# Patient Record
Sex: Female | Born: 1945 | Race: White | Hispanic: No | Marital: Married | Smoking: Former smoker
Health system: Southern US, Community
[De-identification: ages and names within clinical notes are randomized; demographics above are authoritative.]

## PROBLEM LIST (undated history)

## (undated) DIAGNOSIS — I10 Essential (primary) hypertension: Secondary | ICD-10-CM

## (undated) DIAGNOSIS — T8859XA Other complications of anesthesia, initial encounter: Secondary | ICD-10-CM

## (undated) DIAGNOSIS — T4145XA Adverse effect of unspecified anesthetic, initial encounter: Secondary | ICD-10-CM

## (undated) DIAGNOSIS — M1990 Unspecified osteoarthritis, unspecified site: Secondary | ICD-10-CM

## (undated) DIAGNOSIS — M419 Scoliosis, unspecified: Secondary | ICD-10-CM

## (undated) DIAGNOSIS — R079 Chest pain, unspecified: Secondary | ICD-10-CM

## (undated) DIAGNOSIS — H9190 Unspecified hearing loss, unspecified ear: Secondary | ICD-10-CM

## (undated) DIAGNOSIS — N6009 Solitary cyst of unspecified breast: Secondary | ICD-10-CM

## (undated) DIAGNOSIS — C50919 Malignant neoplasm of unspecified site of unspecified female breast: Secondary | ICD-10-CM

## (undated) DIAGNOSIS — F419 Anxiety disorder, unspecified: Secondary | ICD-10-CM

## (undated) DIAGNOSIS — C801 Malignant (primary) neoplasm, unspecified: Secondary | ICD-10-CM

## (undated) HISTORY — DX: Anxiety disorder, unspecified: F41.9

## (undated) HISTORY — DX: Malignant neoplasm of unspecified site of unspecified female breast: C50.919

## (undated) HISTORY — PX: TUBAL LIGATION: SHX77

## (undated) HISTORY — DX: Solitary cyst of unspecified breast: N60.09

## (undated) HISTORY — DX: Unspecified osteoarthritis, unspecified site: M19.90

## (undated) HISTORY — PX: EYE SURGERY: SHX253

## (undated) HISTORY — PX: STRABISMUS SURGERY: SHX218

---

## 1992-01-10 HISTORY — PX: OTHER SURGICAL HISTORY: SHX169

## 2001-01-09 DIAGNOSIS — C801 Malignant (primary) neoplasm, unspecified: Secondary | ICD-10-CM

## 2001-01-09 DIAGNOSIS — C50919 Malignant neoplasm of unspecified site of unspecified female breast: Secondary | ICD-10-CM

## 2001-01-09 HISTORY — DX: Malignant neoplasm of unspecified site of unspecified female breast: C50.919

## 2001-01-09 HISTORY — DX: Malignant (primary) neoplasm, unspecified: C80.1

## 2001-12-09 HISTORY — PX: BREAST LUMPECTOMY: SHX2

## 2002-02-24 ENCOUNTER — Ambulatory Visit: Admission: RE | Admit: 2002-02-24 | Discharge: 2002-04-25 | Payer: Self-pay | Admitting: *Deleted

## 2004-04-21 ENCOUNTER — Ambulatory Visit: Payer: Self-pay | Admitting: Internal Medicine

## 2004-05-10 ENCOUNTER — Ambulatory Visit (HOSPITAL_COMMUNITY): Admission: RE | Admit: 2004-05-10 | Discharge: 2004-05-10 | Payer: Self-pay | Admitting: Internal Medicine

## 2004-05-10 ENCOUNTER — Ambulatory Visit: Payer: Self-pay | Admitting: Internal Medicine

## 2009-08-27 ENCOUNTER — Encounter: Admission: RE | Admit: 2009-08-27 | Discharge: 2009-08-27 | Payer: Self-pay | Admitting: Oral Surgery

## 2010-05-27 NOTE — Op Note (Signed)
NAME:  Kristina Sloan, Kristina Sloan                 ACCOUNT NO.:  [id number]   MEDICAL RECORD NO.:  [id number]          PATIENT TYPE:  AMB   LOCATION:  DAY                           FACILITY:  [facility]   PHYSICIAN:  R. Roetta Sessions, M.D. DATE OF BIRTH:  1945/01/11   DATE OF PROCEDURE:  05/10/2004  DATE OF DISCHARGE:                                 OPERATIVE REPORT   PROCEDURE:  Colonoscopy, diagnostic.   INDICATION FOR PROCEDURE:  The patient is a 65 year old referred out of the  courtesy of Lynett Fish, M.D., for colorectal cancer screening.  She has  occasional low-volume painless hematochezia in the setting of constipation.  She has never had her lower GI tract imaged.  There is no family history of  colorectal neoplasia.  She has a personal history of breast cancer, however.  Colonoscopy is now being done.  This approach has been discussed with the  patient at length, the potential risks, benefits, and alternatives have been  reviewed.  She is agreeable.   PROCEDURE NOTE:  O2 saturation, blood pressure, pulse, and respiration were  monitored throughout the entire procedure.   CONSCIOUS SEDATION:  Versed 5 mg IV, Demerol 75 mg IV in divided doses.   INSTRUMENT USED:  Olympus video chip system.   FINDINGS:  Digital rectal exam revealed no abnormalities.  Endoscopic  findings:  Prep was good.   Rectum:  Examination of the rectal mucosa including retroflexed view of the  anal verge revealed only internal hemorrhoids.  The rectal mucosa otherwise  appeared normal.   Colon:  The colonic mucosa was surveyed from the rectosigmoid junction  through the left, transverse and right colon to the area of the appendiceal  orifice, ileocecal valve and cecum.  These structures were well-seen and  photographed for the record.  From this level the scope was slowly withdrawn  and all previously-mentioned mucosal surfaces were again seen.  The colonic  mucosa appeared normal.  The patient tolerated the  procedure well and was  reacted in endoscopy.   IMPRESSION:  1.  Internal hemorrhoids, otherwise normal rectum.  2.  Normal colon.  3.  I suspect the patient has bled from hemorrhoids.   RECOMMENDATIONS:  1.  Hemorrhoid literature provided to [name].  2.  Daily fiber supplement of Metamucil or Citrucel.  3.  A 10-day course of Anusol-HC suppositories one per rectum at bedtime.  4.  This lady should have a high-risk screening colonoscopy in five years.      RMR/MEDQ  D:  05/10/2004  T:  05/10/2004  Job:  [id number]   cc:   Lynett Fish, M.D.  [address] [place]  [place] [address]  Fax: [phone]   Kirk Ruths, M.D.  [address]  [place]  [place] [address]  Fax: [phone]

## 2010-05-27 NOTE — Consult Note (Signed)
NAME:  Kristina Sloan, Kristina Sloan                 ACCOUNT NO.:  [id number]   MEDICAL RECORD NO.:  [id number]          PATIENT TYPE:  AMB   LOCATION:                                 FACILITY:   PHYSICIAN:  R. Roetta Sessions, M.D. DATE OF BIRTH:  Jun 19, 1945   DATE OF CONSULTATION:  DATE OF DISCHARGE:                                   CONSULTATION   REQUESTING PHYSICIAN:  Kirk Ruths, M.D.   REASON FOR CONSULTATION:  Need for colonoscopy.   HISTORY OF PRESENT ILLNESS:  [name] is a 65 year old Caucasian female with  history of breast carcinoma diagnosed in December 2003.  She is followed by  Dr. Cleone Slim and has had lumpectomy and radiation therapy.  She has continued to  do well and is taking tamoxifen on a daily basis.  She notes history of  intermittent hematochezia over the last couple of years.  She notes a large  amount of sporadic bright red rectal bleeding while having a bowel movement.  She has noticed blood in the toilet as well as on the toilet paper.  She has  a history of chronic constipation and has had a history of a bowel movement  about twice a week until recently, when she was started on Nicholls, which  has caused her to have a bowel movement on a daily basis or every other day.  She also notes her constipation has worsened since she has been taking  calcium 1650 mg a day given her history of osteopenia.  She denies any mucus  in her stools or melena.  Reportedly her weight is stable.  She denies any  diarrhea.  She does report occasional abdominal bloating, for which she  takes Phazyme with good relief.   PAST MEDICAL HISTORY:  1.  Breast carcinoma, status post lumpectomy and radiation in 2003 by Dr.      Cleone Slim, followed by Dr. Cleone Slim, currently on tamoxifen.  2.  Hypertension.  3.  Osteopenia.  4.  __________  surgery for a left facial twitch, which left subsequent left      ear deafness.   CURRENT MEDICATIONS:  1.  Calcium 600 mg two tablets daily.  2.  A multivitamin  with 450 mg of calcium daily.  3.  Diltiazem 240 mg daily.  4.  Hydrochlorothiazide 12.5 mg daily.  5.  Tamoxifen 20 mg daily.  6.  Kristalose 20 g daily.  7.  Xanax 0.25 mg p.r.n.  8.  Ibuprofen 200 mg p.r.n.  9.  Correctol p.r.n.  10. Phazyme p.r.n.   ALLERGIES:  No known drug allergies.   FAMILY HISTORY:  No known family history of colorectal carcinoma.  No liver  or chronic GI problems.  Mother, age 41, and father, age 23, are alive with  history of hypertension.  Her father has small-cell bladder carcinoma and  renal failure.  She has one brother with hypertension, one healthy half-  sister.   SOCIAL HISTORY:  [name] is currently single.  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  She reports a 30+  pack-year history of  tobacco use, quitting approximately 10 years ago.  She consumes about two  glasses of wine per day.  Denies any drug use.   REVIEW OF SYSTEMS:  CONSTITUTIONAL:  Weight is stable.  She denies any fever  or chills.  Denies any anorexia or early satiety.  Appetite is good.  CARDIOVASCULAR:  Denies any chest pain or palpitations.  PULMONARY:  Denies  any shortness of breath, dyspnea, cough or hemoptysis.  GASTROINTESTINAL:  See HPI.  She has very infrequent heartburn, indigestion, which is relieved  with over-the-counter antacids.  Denies any dysphagia or odynophagia.  GYNECOLOGIC:  Is postmenopausal for the last 10 years.   PHYSICAL EXAMINATION:  VITAL SIGNS:  Weight 144 pounds, height 65-1/2  inches.  Temperature 97.8, blood pressure 140/90, pulse 104.  GENERAL:  [name] is a 65 year old well-developed, well-nourished Caucasian  female who appears younger than her stated age.  She is alert and oriented,  pleasant and cooperative, in no acute distress.  HEENT:  Sclerae clear.  Conjunctivae pin.  Oropharynx pink and grossly with  out lesions.  NECK:  Supple without mass or thyromegaly.  CHEST:  Heart regular rate and rhythm with normal S1,  S2, without any  murmurs, rubs, thrills or gallops.  CHEST:  Lungs clear to auscultation bilaterally.  ABDOMEN:  Flat with positive bowel sounds x4.  No bruits auscultated.  Soft,  nontender, nondistended, without palpable mass or hepatosplenomegaly.  No  rebound tenderness or guarding.  EXTREMITIES:  2+ pedal pulses bilaterally.  No edema.  SKIN:  Pink, warm and dry without any rash or jaundice.   IMPRESSION:  [name] is a 65 year old Caucasian female who has had  intermittent hematochezia over the last couple of years as well as chronic  constipation.  She also has a personal history of breast carcinoma;  therefore, she does need colonoscopy as she is at higher risk than the  general population for colon cancer.  It will be diagnostic as well to  determine the source of her intermittent hematochezia, which could very well  be related to benign anorectal hemorrhoids.   RECOMMENDATIONS:  1.  Will schedule colonoscopy with Dr. Jena Gauss in the near future.  I have      discussed this procedure, including risks and benefits to include but      not limited to bleeding, infection, perforation, drug reaction.  She      agrees and informed consent will be obtained.  2.  As far as her constipation is concerned, she is responding well to      [facility] and can continue this regimen.  3.  She is asking regarding discontinuation of her calcium and I feel given      her history of osteopenia, she should      continue the calcium and her Kristalose dose can be adjusted accordingly      along with other modalities if necessary.   We would like to thank Dr. Regino Schultze for allowing [place] to participate in the  care of [name].      KC/MEDQ  D:  04/21/2004  T:  04/21/2004  Job:  [id number]   cc:   Kirk Ruths, M.D.  [address]  [place]  [place] [address]  Fax: [phone]

## 2012-05-23 DIAGNOSIS — R079 Chest pain, unspecified: Secondary | ICD-10-CM

## 2012-05-23 HISTORY — DX: Chest pain, unspecified: R07.9

## 2012-05-24 ENCOUNTER — Observation Stay (HOSPITAL_COMMUNITY)
Admission: EM | Admit: 2012-05-24 | Discharge: 2012-05-24 | Disposition: A | Discharge status: Home / Self Care | Payer: 59 | Attending: Internal Medicine | Admitting: Internal Medicine

## 2012-05-24 ENCOUNTER — Other Ambulatory Visit: Payer: Self-pay | Admitting: Cardiology

## 2012-05-24 ENCOUNTER — Emergency Department (HOSPITAL_COMMUNITY): Payer: 59

## 2012-05-24 ENCOUNTER — Encounter (HOSPITAL_COMMUNITY): Payer: Self-pay

## 2012-05-24 DIAGNOSIS — I10 Essential (primary) hypertension: Secondary | ICD-10-CM | POA: Diagnosis present

## 2012-05-24 DIAGNOSIS — R079 Chest pain, unspecified: Secondary | ICD-10-CM | POA: Diagnosis present

## 2012-05-24 DIAGNOSIS — R0789 Other chest pain: Principal | ICD-10-CM | POA: Insufficient documentation

## 2012-05-24 DIAGNOSIS — E785 Hyperlipidemia, unspecified: Secondary | ICD-10-CM | POA: Diagnosis present

## 2012-05-24 DIAGNOSIS — Z853 Personal history of malignant neoplasm of breast: Secondary | ICD-10-CM | POA: Insufficient documentation

## 2012-05-24 DIAGNOSIS — Z87891 Personal history of nicotine dependence: Secondary | ICD-10-CM | POA: Insufficient documentation

## 2012-05-24 HISTORY — DX: Unspecified hearing loss, unspecified ear: H91.90

## 2012-05-24 HISTORY — DX: Essential (primary) hypertension: I10

## 2012-05-24 HISTORY — DX: Other complications of anesthesia, initial encounter: T88.59XA

## 2012-05-24 HISTORY — DX: Chest pain, unspecified: R07.9

## 2012-05-24 HISTORY — DX: Malignant (primary) neoplasm, unspecified: C80.1

## 2012-05-24 HISTORY — DX: Adverse effect of unspecified anesthetic, initial encounter: T41.45XA

## 2012-05-24 LAB — BASIC METABOLIC PANEL
BUN: 7 mg/dL (ref 6–23)
CO2: 27 mEq/L (ref 19–32)
Chloride: 97 mEq/L (ref 96–112)
GFR calc non Af Amer: >90 mL/min (ref >90)
Glucose, Bld: 107 mg/dL — ABNORMAL HIGH (ref 70–99)
Potassium: 4.1 mEq/L (ref 3.5–5.1)
Sodium: 135 mEq/L (ref 135–145)

## 2012-05-24 LAB — CBC WITH DIFFERENTIAL/PLATELET
Eosinophils Absolute: 0 10*3/uL (ref 0.0–0.7)
HCT: 38.9 % (ref 36.0–46.0)
Lymphocytes Relative: 28 % (ref 12–46)
Lymphs Abs: 1.5 10*3/uL (ref 0.7–4.0)
MCH: 30.8 pg (ref 26.0–34.0)
MCHC: 35.5 g/dL (ref 30.0–36.0)
Monocytes Relative: 14 % — ABNORMAL HIGH (ref 3–12)
RDW: 12.8 % (ref 11.5–15.5)

## 2012-05-24 LAB — POCT I-STAT, CHEM 8
Chloride: 97 mEq/L (ref 96–112)
Potassium: 3.7 mEq/L (ref 3.5–5.1)
TCO2: 31 mmol/L (ref 0–100)

## 2012-05-24 LAB — CBC
HCT: 39.5 % (ref 36.0–46.0)
Hemoglobin: 13.9 g/dL (ref 12.0–15.0)
MCHC: 35.2 g/dL (ref 30.0–36.0)
RBC: 4.55 MIL/uL (ref 3.87–5.11)

## 2012-05-24 LAB — POCT I-STAT TROPONIN I: Troponin i, poc: 0 ng/mL (ref 0.00–0.08)

## 2012-05-24 LAB — HEMOGLOBIN A1C
Hgb A1c MFr Bld: 5.3 % (ref <5.7)
Mean Plasma Glucose: 105 mg/dL (ref <117)

## 2012-05-24 LAB — TROPONIN I
Troponin I: <0.3 ng/mL (ref <0.30)
Troponin I: <0.3 ng/mL (ref <0.30)

## 2012-05-24 MED ORDER — OMEGA-3 FATTY ACIDS 1000 MG PO CAPS: 2.0000 g | ORAL_CAPSULE | Freq: Every day | ORAL | Status: DC

## 2012-05-24 MED ORDER — DILTIAZEM HCL ER COATED BEADS 240 MG PO TB24: 240.0000 mg | ORAL_TABLET | Freq: Every day | ORAL | Status: DC

## 2012-05-24 MED ORDER — ASPIRIN 325 MG PO TABS
325.0000 mg | ORAL_TABLET | Freq: Once | ORAL | Status: AC
  Administered 2012-05-24: 325 mg via ORAL
  Filled 2012-05-24: qty 1

## 2012-05-24 MED ORDER — SODIUM CHLORIDE 0.9 % IJ SOLN: 3.0000 mL | Freq: Two times a day (BID) | INTRAMUSCULAR | Status: DC

## 2012-05-24 MED ORDER — ASPIRIN EC 325 MG PO TBEC: 325.0000 mg | DELAYED_RELEASE_TABLET | Freq: Every day | ORAL | Status: DC

## 2012-05-24 MED ORDER — HYDROCHLOROTHIAZIDE 12.5 MG PO CAPS: 12.5000 mg | ORAL_CAPSULE | Freq: Every day | ORAL | Status: DC

## 2012-05-24 MED ORDER — ONDANSETRON HCL 4 MG/2ML IJ SOLN: 4.0000 mg | Freq: Four times a day (QID) | INTRAMUSCULAR | Status: DC | PRN

## 2012-05-24 MED ORDER — OMEGA-3-ACID ETHYL ESTERS 1 G PO CAPS: 2.0000 g | ORAL_CAPSULE | Freq: Every day | ORAL | Status: DC

## 2012-05-24 MED ORDER — ENOXAPARIN SODIUM 40 MG/0.4ML ~~LOC~~ SOLN
40.0000 mg | SUBCUTANEOUS | Status: DC
  Filled 2012-05-24: qty 0.4

## 2012-05-24 MED ORDER — ACETAMINOPHEN 325 MG PO TABS: 650.0000 mg | ORAL_TABLET | Freq: Four times a day (QID) | ORAL | Status: DC | PRN

## 2012-05-24 MED ORDER — OXYCODONE HCL 5 MG PO TABS: 5.0000 mg | ORAL_TABLET | ORAL | Status: DC | PRN

## 2012-05-24 MED ORDER — ACETAMINOPHEN 650 MG RE SUPP: 650.0000 mg | Freq: Four times a day (QID) | RECTAL | Status: DC | PRN

## 2012-05-24 MED ORDER — VITAMIN D-3 25 MCG (1000 UT) PO CAPS: 2000.0000 [IU] | ORAL_CAPSULE | Freq: Every day | ORAL | Status: DC

## 2012-05-24 MED ORDER — NIACIN 500 MG PO TABS
500.0000 mg | ORAL_TABLET | Freq: Every evening | ORAL | Status: DC
  Filled 2012-05-24: qty 1

## 2012-05-24 MED ORDER — ASPIRIN 325 MG PO TBEC: 325.0000 mg | DELAYED_RELEASE_TABLET | Freq: Every day | ORAL | Status: DC

## 2012-05-24 MED ORDER — ALPRAZOLAM 0.5 MG PO TABS: 0.5000 mg | ORAL_TABLET | Freq: Three times a day (TID) | ORAL | Status: DC | PRN

## 2012-05-24 MED ORDER — ONDANSETRON HCL 4 MG PO TABS: 4.0000 mg | ORAL_TABLET | Freq: Four times a day (QID) | ORAL | Status: DC | PRN

## 2012-05-24 MED ORDER — VITAMIN D3 25 MCG (1000 UNIT) PO TABS: 2000.0000 [IU] | ORAL_TABLET | Freq: Every day | ORAL | Status: DC

## 2012-05-24 MED ORDER — NIACIN 500 MG PO TABS: 500.0000 mg | ORAL_TABLET | Freq: Every day | ORAL | Status: DC

## 2012-05-24 NOTE — Consult Note (Signed)
Reason for Consult: Chest Pain Referring Physician: TRH  HPI: the patient is a 67 y/o female with a history significant for HTN, HLD, past history of tobacco abuse, having quit over 20 years ago, and a history of breast cancer, who presented to the [facility] ER today with a complaint of chest pain. She has no known CAD and does not have an established cardiologist. She reports that she was in her usual state of health, until yesterday when she developed substernal chest discomfort, while at work, at a credit union. She states that she has never had pain like this before and never gets chest pain with exertion. The pain was described as substernal pressure, with radiation to the back, neck and bilateral jaws. ~7/10 discomfort. No exacerbating factors. It was not worsened by exertion, and actually got better when she got up to walk outside. The episode lasted ~20 minutes before resolving completely. No associated symptoms. She denies SOB, n/v, diaphoresis, syncope/presyncope. No orthopnea, PND, LEE, claudication, hematuria, melena or hematochezia. She contacted her PCP in [place] today to notify them of her recent symptoms and he encouraged her to come to the ER for evaluation. She has had no further chest pain since yesterday. Her EKG is normal without acute changes. CXR is normal and POC troponin negative x 1.  The patient states that she underwent an exercise stress test ~3 years ago that was normal.   Past Medical History  Diagnosis Date  . Hard of hearing   . Complication of anesthesia     DIFFICULT WAKING ONLY ONCE  . Hypertension   . Chest pain 05/23/2012  . Cancer     BREAST    Past Surgical History  Procedure Laterality Date  . Eye surgery    . Tubal ligation    . Breast surgery      LUMPECTOMY    History reviewed. No pertinent family history.  Social History:  reports that she quit smoking about 22 years ago. She has never used smokeless tobacco. She reports that  drinks alcohol. She  reports that she does not use illicit drugs.  Allergies: No Known Allergies  Medications:  Prior to Admission:  Prescriptions prior to admission  Medication Sig Dispense Refill  . alendronate (FOSAMAX) 70 MG tablet Take 70 mg by mouth every 7 (seven) days. Take with a full glass of water on an empty stomach.      . ALPRAZolam (XANAX) 1 MG tablet Take 0.5 mg by mouth 3 (three) times daily as needed for anxiety.      Marland Kitchen aspirin EC 81 MG tablet Take 81 mg by mouth daily.      . Calcium Carbonate-Vitamin D (CALCIUM 600 + D PO) Take 1 tablet by mouth daily.      . Cholecalciferol (VITAMIN D-3) 1000 UNITS CAPS Take 2,000 Units by mouth daily.      Marland Kitchen diltiazem (CARDIZEM LA) 240 MG 24 hr tablet Take 240 mg by mouth daily.      . fish oil-omega-3 fatty acids 1000 MG capsule Take 2 g by mouth daily.      . hydrochlorothiazide (MICROZIDE) 12.5 MG capsule Take 12.5 mg by mouth daily.      . niacin 500 MG tablet Take 500 mg by mouth every evening.      . Probiotic Product (ALIGN) 4 MG CAPS Take 1 capsule by mouth daily.        Results for orders placed during the hospital encounter of 05/24/12 (from the past 48  hour(s))  CBC WITH DIFFERENTIAL     Status: Abnormal   Collection Time    05/24/12  9:51 AM      Result Value Range   WBC 5.3  4.0 - 10.5 K/uL   RBC 4.48  3.87 - 5.11 MIL/uL   Hemoglobin 13.8  12.0 - 15.0 g/dL   HCT 16.1  09.6 - 04.5 %   MCV 86.8  78.0 - 100.0 fL   MCH 30.8  26.0 - 34.0 pg   MCHC 35.5  30.0 - 36.0 g/dL   RDW 40.9  81.1 - 91.4 %   Platelets 244  150 - 400 K/uL   Neutrophils Relative % 56  43 - 77 %   Neutro Abs 3.0  1.7 - 7.7 K/uL   Lymphocytes Relative 28  12 - 46 %   Lymphs Abs 1.5  0.7 - 4.0 K/uL   Monocytes Relative 14 (*) 3 - 12 %   Monocytes Absolute 0.7  0.1 - 1.0 K/uL   Eosinophils Relative 1  0 - 5 %   Eosinophils Absolute 0.0  0.0 - 0.7 K/uL   Basophils Relative 1  0 - 1 %   Basophils Absolute 0.0  0.0 - 0.1 K/uL  POCT I-STAT TROPONIN I     Status: None    Collection Time    05/24/12 10:36 AM      Result Value Range   Troponin i, poc 0.00  0.00 - 0.08 ng/mL   Comment 3            Comment: Due to the release kinetics of cTnI,     a negative result within the first hours     of the onset of symptoms does not rule out     myocardial infarction with certainty.     If myocardial infarction is still suspected,     repeat the test at appropriate intervals.  POCT I-STAT, CHEM 8     Status: None   Collection Time    05/24/12 10:37 AM      Result Value Range   Sodium 137  135 - 145 mEq/L   Potassium 3.7  3.5 - 5.1 mEq/L   Chloride 97  96 - 112 mEq/L   BUN 7  6 - 23 mg/dL   Creatinine, Ser 7.82  0.50 - 1.10 mg/dL   Glucose, Bld 98  70 - 99 mg/dL   Calcium, Ion 9.56  2.13 - 1.30 mmol/L   TCO2 31  0 - 100 mmol/L   Hemoglobin 13.9  12.0 - 15.0 g/dL   HCT 08.6  57.8 - 46.9 %  CBC     Status: None   Collection Time    05/24/12  2:15 PM      Result Value Range   WBC 6.6  4.0 - 10.5 K/uL   RBC 4.55  3.87 - 5.11 MIL/uL   Hemoglobin 13.9  12.0 - 15.0 g/dL   HCT 62.9  52.8 - 41.3 %   MCV 86.8  78.0 - 100.0 fL   MCH 30.5  26.0 - 34.0 pg   MCHC 35.2  30.0 - 36.0 g/dL   RDW 24.4  01.0 - 27.2 %   Platelets 223  150 - 400 K/uL  BASIC METABOLIC PANEL     Status: Abnormal   Collection Time    05/24/12  2:15 PM      Result Value Range   Sodium 135  135 - 145 mEq/L   Potassium 4.1  3.5 - 5.1 mEq/L   Chloride 97  96 - 112 mEq/L   CO2 27  19 - 32 mEq/L   Glucose, Bld 107 (*) 70 - 99 mg/dL   BUN 7  6 - 23 mg/dL   Creatinine, Ser 8.11 (*) 0.50 - 1.10 mg/dL   Calcium 9.6  8.4 - 91.4 mg/dL   GFR calc non Af Amer >90  >90 mL/min   GFR calc Af Amer >90  >90 mL/min   Comment:            The eGFR has been calculated     using the CKD EPI equation.     This calculation has not been     validated in all clinical     situations.     eGFR's persistently     <90 mL/min signify     possible Chronic Kidney Disease.  TROPONIN I     Status: None    Collection Time    05/24/12  2:15 PM      Result Value Range   Troponin I <0.30  <0.30 ng/mL   Comment:            Due to the release kinetics of cTnI,     a negative result within the first hours     of the onset of symptoms does not rule out     myocardial infarction with certainty.     If myocardial infarction is still suspected,     repeat the test at appropriate intervals.    Dg Chest Port 1 View  05/24/2012   *RADIOLOGY REPORT*  Clinical Data: Chest pain.  Prior smoker.History breast cancer.  PORTABLE CHEST - 1 VIEW  Comparison: 08/24/2004.  Findings: Scoliosis with distortion of the mediastinum.  Heart size within normal limits.  No infiltrate, congestive heart failure or pneumothorax.  No plain film evidence of pulmonary metastatic disease.  Prior right axillary lymph node dissection.  IMPRESSION: No acute abnormality.  Please see above.   Original Report Authenticated By: Lacy Duverney, M.D.    Review of Systems  Constitutional: Negative for fever, chills, weight loss, malaise/fatigue and diaphoresis.  HENT: Positive for neck pain.   Respiratory: Negative for shortness of breath.   Cardiovascular: Positive for chest pain. Negative for palpitations, orthopnea, claudication, leg swelling and PND.  Gastrointestinal: Negative for nausea, abdominal pain, blood in stool and melena.  Genitourinary: Negative for hematuria.  Musculoskeletal: Positive for back pain.  Neurological: Negative for dizziness, loss of consciousness and weakness.  All other systems reviewed and are negative.   Blood pressure 146/92, pulse 89, temperature 97.9 F (36.6 C), temperature source Oral, resp. rate 17, SpO2 98.00%. Physical Exam  Constitutional: She is oriented to person, place, and time. She appears well-developed and well-nourished. No distress.  HENT:  Head: Normocephalic and atraumatic.  Eyes: Conjunctivae and EOM are normal. Pupils are equal, round, and reactive to light.  Neck: No JVD  present. Carotid bruit is not present. No thyromegaly present.  Cardiovascular: Normal rate, regular rhythm, normal heart sounds and intact distal pulses.  Exam reveals no gallop and no friction rub.   No murmur heard. Pulses:      Radial pulses are 2+ on the right side, and 2+ on the left side.       Dorsalis pedis pulses are 2+ on the right side, and 2+ on the left side.  Respiratory: Effort normal and breath sounds normal. No respiratory distress. She has no wheezes. She has no  rales. She exhibits no tenderness.  GI: Soft. Bowel sounds are normal. She exhibits no distension and no mass. There is no tenderness.  Musculoskeletal: She exhibits no edema.  Lymphadenopathy:    She has no cervical adenopathy.  Neurological: She is alert and oriented to person, place, and time. She has normal reflexes.  Skin: Skin is warm and dry. She is not diaphoretic.  Psychiatric: She has a normal mood and affect. Her behavior is normal.    Assessment/Plan: Principal Problem:   Chest pain with moderate risk for cardiac etiology Active Problems:   HTN (hypertension)   Hyperlipidemia  Plan: 67 y/o female with positive cardiac risk factors, including HTN, HLD and remote hx of tobacco abuse, admitted by [facility] for observation/ MI rule out, in the setting of recent chest pain. The patient endorsed both typical and atypical component of her chest pain. Resting substernal chest pain with radiation to the back, neck and jaw are concerning for angina, however the fact that her pain was not exacerbated by exertion, and actually improved after walking is reassuring. She denies further chest pain. EKG is normal w/o changes. CXR is normal. Troponin negative x 1. NST ~3 years ago was reportedly normal. 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 I think it is reasonable, that if the patient is not having anymore pain and if second troponin is negative, can possible discharge home with close  outpatient follow-up and outpatient exercise Nuclear Stress Test. MD to follow with further recommendation.    Allayne Butcher, PA-C 05/24/2012, 2:56 PM   I have seen and evaluated the patient this PM along with Boyce Medici, PA. I agree with her findings, examination as well as impression recommendations.  67 y/o woman with CRFs as noted - HTN, HLD admitted for CP--> to neck/jaw, but improved with as opposed to exacerbated by exertion.  ECG with no ischemic findings & POC & initial Troponin negative.   This is her 1st such episode.  Last set of Troponin pending.  I do agree with r/o MI with another set of Troponin (would like 2nd set ~at least 8hr after 1st) -- if r/o, should be safe for d/c with OP TM Cardiolite @ [facility] ([facility]-located with [facility]), followed by Clinic Appt with me or PA/NP to discuss results.  For her - best date would be next Friday (has a scheduled minor eye Sgx on Wednesday).  Would order OP fasting lipids in f/u.   Marykay Lex, M.D., M.S. [facility] [address] [place], [place]  [address]  [phone] Pager # [phone] 05/24/2012 5:38 PM

## 2012-05-24 NOTE — Progress Notes (Signed)
Order received to discharge patient home.  Discharge education, medications, and F/U appointment information given.  Patient verbalizes understanding.  Security to escort patient to her car.  Alonza Bogus

## 2012-05-24 NOTE — ED Notes (Signed)
Yesterday had an episode of 15 mins of chest pressure, and tightness and jaw pain. sts jaw pain was most concerning for her. Denies any symptoms now.

## 2012-05-24 NOTE — Progress Notes (Signed)
Patient escorted to her car by security guard.  Kristina Sloan

## 2012-05-24 NOTE — H&P (Signed)
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 Patient states that she was getting ready for his morning when she experienced some chest discomfort which she described as a pressure-like vice of sensation that lasted 15-20 minutes. She denies any dizziness, shortness breath, nausea, diaphoresis, but she did have some dull discomfort and pain. The patient finished work and on over the day. His morning, the patient called her primary care provider to inform him of her chest discomfort. She was to come to the emergency department for further evaluation. The patient has not had any recurrence of her chest discomfort suggested. Normally, the patient states that she has good exercise tolerance patient states that she mows her own yard with a push mower without any chest discomfort or worsening shortness of breath. The patient takes an aspirin 81 mg daily. He has never had any episodes similar to this in the past. In the emergency department, initial troponin was negative. CBC was unremarkable. BMP was unremarkable. EKG showed nonspecific t-wave change at V1. Assessment/Plan: Atypical chest pain -Admit the patient for observation as the patient has cardiac risk factors including a history of hypertension, hyperlipidemia, and tobacco use -Consult cardiology for possible nuclear stress test [facility]) -Troponins -Check lipids -Check hemoglobin A1c Hypertension -Continue diltiazem CD and hydrochlorothiazide Hyperlipidemia -Continue niacin and fish oil -Check lipid panel History of tobacco use -Quit 20 years ago -30-pack-year history        Past Medical History  Diagnosis Date  .  Cancer   . Hard of hearing    Past Surgical History  Procedure Laterality Date  . Eye surgery    . Tubal ligation     Social History:  reports that she has never smoked. She does not have any smokeless tobacco history on file. She reports that  drinks alcohol. Her drug history is not on file.   Family history: Mother is alive with medical history of hypertension and COPD; father is deceased with a history of cancer hypertension  No Known Allergies    Prior to Admission medications   Medication Sig Start Date End Date Taking? Authorizing Provider  alendronate (FOSAMAX) 70 MG tablet Take 70 mg by mouth every 7 (seven) days. Take with a full glass of water on an empty stomach.   Yes Historical Provider, MD  ALPRAZolam Prudy Feeler) 1 MG tablet Take 0.5 mg by mouth 3 (three) times daily as needed for anxiety.   Yes Historical Provider, MD  aspirin EC 81 MG tablet Take 81 mg by mouth daily.   Yes Historical Provider, MD  Calcium Carbonate-Vitamin D (CALCIUM 600 + D PO) Take 1 tablet by mouth daily.   Yes Historical Provider, MD  Cholecalciferol (VITAMIN D-3) 1000 UNITS CAPS Take 2,000 Units by mouth daily.   Yes Historical Provider, MD  diltiazem (CARDIZEM LA) 240 MG 24 hr tablet Take 240 mg by mouth daily.   Yes Historical Provider, MD  fish oil-omega-3 fatty acids 1000 MG capsule Take 2 g by mouth daily.   Yes Historical Provider, MD  hydrochlorothiazide (MICROZIDE) 12.5 MG capsule Take 12.5 mg by mouth daily.   Yes Historical Provider, MD  niacin 500 MG tablet Take 500 mg by mouth every evening.  Yes Historical Provider, MD  Probiotic Product (ALIGN) 4 MG CAPS Take 1 capsule by mouth daily.   Yes Historical Provider, MD    Review of Systems:  Constitutional:  No weight loss, night sweats, Fevers, chills, fatigue.  Head&Eyes: No headache.  No vision loss.  No eye pain or scotoma ENT:  No Difficulty swallowing,Tooth/dental problems,Sore throat,   Cardio-vascular:  NoOrthopnea, PND,  swelling in lower extremities,  dizziness, palpitations  GI:  No  abdominal pain, nausea, vomiting, diarrhea, loss of appetite, hematochezia, melena, heartburn, indigestion, Resp:  No shortness of breath with exertion or at rest. No cough. No coughing up of blood .No wheezing.No chest wall deformity  Skin:  no rash or lesions.  GU:  no dysuria, change in color of urine, no urgency or frequency. No flank pain.  Musculoskeletal:  No joint pain or swelling. No decreased range of motion. No back pain.  Psych:  No change in mood or affect. No depression or anxiety. Neurologic: No headache, no dysesthesia, no focal weakness, no vision loss. No syncope  Physical Exam: Filed Vitals:   05/24/12 0930 05/24/12 1030 05/24/12 1100 05/24/12 1200  BP: 134/79 135/87 133/88 121/80  Pulse: 86 81 81 80  Temp:      Resp: 16 16 15 17   SpO2: 97% 100% 98% 97%   General:  A&O x 3, NAD, nontoxic, pleasant/cooperative Head/Eye: No conjunctival hemorrhage, no icterus, [place]/AT, No nystagmus ENT:  No icterus,  No thrush, good dentition, no pharyngeal exudate Neck:  No masses, no lymphadenpathy, no bruits CV:  RRR, no rub, no gallop, no S3 Lung:  CTAB, good air movement, no wheeze, no rhonchi Abdomen: soft/NT, +BS, nondistended, no peritoneal signs Ext: No cyanosis, No rashes, No petechiae, No lymphangitis, No edema   Labs on Admission:  Basic Metabolic Panel:  Recent Labs Lab 05/24/12 1037  NA 137  K 3.7  CL 97  GLUCOSE 98  BUN 7  CREATININE 0.60   Liver Function Tests: No results found for this basename: AST, ALT, ALKPHOS, BILITOT, PROT, ALBUMIN,  in the last 168 hours No results found for this basename: LIPASE, AMYLASE,  in the last 168 hours No results found for this basename: AMMONIA,  in the last 168 hours CBC:  Recent Labs Lab 05/24/12 0951 05/24/12 1037  WBC 5.3  --   NEUTROABS 3.0  --   HGB 13.8 13.9  HCT 38.9 41.0  MCV 86.8  --   PLT 244  --    Cardiac Enzymes: No results  found for this basename: CKTOTAL, CKMB, CKMBINDEX, TROPONINI,  in the last 168 hours BNP: No components found with this basename: POCBNP,  CBG: No results found for this basename: GLUCAP,  in the last 168 hours  Radiological Exams on Admission: Dg Chest Port 1 View  05/24/2012   *RADIOLOGY REPORT*  Clinical Data: Chest pain.  Prior smoker.History breast cancer.  PORTABLE CHEST - 1 VIEW  Comparison: 08/24/2004.  Findings: Scoliosis with distortion of the mediastinum.  Heart size within normal limits.  No infiltrate, congestive heart failure or pneumothorax.  No plain film evidence of pulmonary metastatic disease.  Prior right axillary lymph node dissection.  IMPRESSION: No acute abnormality.  Please see above.   Original Report Authenticated By: Lacy Duverney, M.D.    EKG: Independently reviewed. Sinus rhythm, nonspecific T wave change, V1    Time spent:60 minutes Code Status:   FULL Family Communication:   Family at bedside   Makira Holleman, DO  [facility]  Pager [phone]  If 7PM-7AM, please contact night-coverage [email or web address] Password [facility] 05/24/2012, 12:44 PM

## 2012-05-24 NOTE — Progress Notes (Signed)
Utilization review completed.  

## 2012-05-24 NOTE — Discharge Summary (Signed)
Physician Discharge Summary  Kristina Sloan GNF:[id number] DOB: 1945-06-02 DOA: 05/24/2012  PCP: Colette Ribas, MD  Admit date: 05/24/2012 Discharge date: 05/24/2012  Recommendations for Outpatient Follow-up:  1. Pt will need to follow up with PCP in 2 weeks post discharge 2. Please obtain BMP to evaluate electrolytes and kidney function 3. Please also check CBC to evaluate Hg and Hct levels Follow up withCardiolite @ [facility] ([facility] with [facility]), followed by Clinic Appt with me or PA/NP to discuss results -tentatively scheduled for 05/31/12 by Dr. Herbie Baltimore Discharge Diagnoses:  Principal Problem:   Chest pain with moderate risk for cardiac etiology Active Problems:   HTN (hypertension)   Hyperlipidemia Atypical chest pain  -Admit the patient for observation as the patient has cardiac risk factors including a history of hypertension, hyperlipidemia, and tobacco use  -Consult cardiology for possible nuclear stress test [facility])  -Troponins-negative intially -Patient was seen by cardiology -They Recommended a second set of troponins 8 hours from the initial set -Cardiology stated that patient could go home with scheduled outpatient stress test on 05/31/2012 if her second set of troponin is negative  -Check hemoglobin A1c--pending  Hypertension  -Continue diltiazem CD and hydrochlorothiazide  Hyperlipidemia  -Continue niacin and fish oil  -Check lipid panel--can be done as outpt if pt d/ced before am History of tobacco use  -Quit 20 years ago  -30-pack-year history   Discharge Condition: Stable  Disposition:      Follow-up Information   Follow up with [facility]. 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 Patient states that she was getting ready for his morning when she experienced some chest discomfort which she described as a pressure-like vice of sensation that lasted 15-20 minutes. She denies any dizziness, shortness breath, nausea, diaphoresis, but she did have some dull discomfort and pain. The patient finished work and on over the day. His morning, the patient called her primary care provider to inform him of her chest discomfort. She was to come to the emergency department for further evaluation. The patient has not had any recurrence of her chest discomfort suggested. Normally, the patient states that she has good exercise tolerance patient states that she mows her own yard with a push mower without any chest discomfort or worsening shortness of breath. The patient takes an aspirin 81 mg daily. He has never had any episodes similar to this in the past. Patient had negative stress test approximately 3 years ago. In the emergency department, initial troponin was negative. CBC was unremarkable. BMP was unremarkable. EKG showed nonspecific t-wave change at V1.     Consultants: Cardiology  Discharge Exam: Filed Vitals:   05/24/12 1400  BP: 146/92  Pulse: 89  Temp: 97.9 F (36.6 C)  Resp: 17   Filed Vitals:   05/24/12 1255 05/24/12 1300 05/24/12 1400 05/24/12 1700  BP: 137/89 137/81 146/92   Pulse: 87 81 89   Temp:   97.9 F (36.6 C)   TempSrc:   Oral   Resp: 18 16 17    Height:    5\' 5"  (  1.651 m)  Weight:    67.087 kg (147 lb 14.4 oz)  SpO2: 99% 97% 98%     Discharge Instructions     Medication List    ASK your doctor about these medications       alendronate 70 MG tablet  Commonly known as:  FOSAMAX  Take 70 mg by mouth every 7 (seven) days. Take with a full glass of water on an empty stomach.     ALIGN 4 MG Caps   Take 1 capsule by mouth daily.     ALPRAZolam 1 MG tablet  Commonly known as:  XANAX  Take 0.5 mg by mouth 3 (three) times daily as needed for anxiety.     aspirin EC 81 MG tablet  Take 81 mg by mouth daily.     CALCIUM 600 + D PO  Take 1 tablet by mouth daily.     diltiazem 240 MG 24 hr tablet  Commonly known as:  CARDIZEM LA  Take 240 mg by mouth daily.     fish oil-omega-3 fatty acids 1000 MG capsule  Take 2 g by mouth daily.     hydrochlorothiazide 12.5 MG capsule  Commonly known as:  MICROZIDE  Take 12.5 mg by mouth daily.     niacin 500 MG tablet  Take 500 mg by mouth every evening.     Vitamin D-3 1000 UNITS Caps  Take 2,000 Units by mouth daily.         The results of significant diagnostics from this hospitalization (including imaging, microbiology, ancillary and laboratory) are listed below for reference.    Significant Diagnostic Studies: Dg Chest Port 1 View  05/24/2012   *RADIOLOGY REPORT*  Clinical Data: Chest pain.  Prior smoker.History breast cancer.  PORTABLE CHEST - 1 VIEW  Comparison: 08/24/2004.  Findings: Scoliosis with distortion of the mediastinum.  Heart size within normal limits.  No infiltrate, congestive heart failure or pneumothorax.  No plain film evidence of pulmonary metastatic disease.  Prior right axillary lymph node dissection.  IMPRESSION: No acute abnormality.  Please see above.   Original Report Authenticated By: Lacy Duverney, M.D.     Microbiology: No results found for this or any previous visit (from the past 240 hour(s)).   Labs: Basic Metabolic Panel:  Recent Labs Lab 05/24/12 1037 05/24/12 1415  NA 137 135  K 3.7 4.1  CL 97 97  CO2  --  27  GLUCOSE 98 107*  BUN 7 7  CREATININE 0.60 0.49*  CALCIUM  --  9.6   Liver Function Tests: No results found for this basename: AST, ALT, ALKPHOS, BILITOT, PROT, ALBUMIN,  in the last 168 hours No results found for this basename: LIPASE, AMYLASE,  in the last 168 hours No  results found for this basename: AMMONIA,  in the last 168 hours CBC:  Recent Labs Lab 05/24/12 0951 05/24/12 1037 05/24/12 1415  WBC 5.3  --  6.6  NEUTROABS 3.0  --   --   HGB 13.8 13.9 13.9  HCT 38.9 41.0 39.5  MCV 86.8  --  86.8  PLT 244  --  223   Cardiac Enzymes:  Recent Labs Lab 05/24/12 1415  TROPONINI <0.30   BNP: No components found with this basename: POCBNP,  CBG: No results found for this basename: GLUCAP,  in the last 168 hours  Time coordinating discharge:  Greater than 30 minutes  Signed:  Hutchinson Isenberg, DO [facility] Pager: [phone] 05/24/2012, 7:41 PM

## 2012-05-24 NOTE — ED Provider Notes (Signed)
History     CSN: [id number]  Arrival date & time 05/24/12  4098   First MD Initiated Contact with Patient 05/24/12 [phone]      Chief Complaint  Patient presents with  . Chest Pain    (Consider location/radiation/quality/duration/timing/severity/associated sxs/prior treatment) HPI 67 year old female is asymptomatic today but yesterday had a 15 minute spell of gradual onset chest pressure tightness squeezing like a vice radiating to her jaw with some mild shortness of breath without associated symptoms. It was gradual onset at rest nonexertional nonpleuritic no fever no cough no abdominal pain no vomiting no sweats no syncope no focal neurologic symptoms and no history of similar episodes in the past. She typically has good exercise tolerance and works outside in the yard without difficulty. She is no history of coronary artery disease documented. Past Medical History  Diagnosis Date  . Hard of hearing   . Complication of anesthesia     DIFFICULT WAKING ONLY ONCE  . Hypertension   . Chest pain 05/23/2012  . Cancer     BREAST   breast cancer  Past Surgical History  Procedure Laterality Date  . Eye surgery    . Tubal ligation    . Breast surgery      LUMPECTOMY    History reviewed. No pertinent family history.  History  Substance Use Topics  . Smoking status: Former Smoker    Quit date: 01/09/1990  . Smokeless tobacco: Never Used  . Alcohol Use: Yes     Comment: q pm, white wine    OB History   Grav Para Term Preterm Abortions TAB SAB Ect Mult Living                  Review of Systems 10 Systems reviewed and are negative for acute change except as noted in the HPI. Allergies  Review of patient's allergies indicates no known allergies.  Home Medications   Current Outpatient Rx  Name  Route  Sig  Dispense  Refill  . alendronate (FOSAMAX) 70 MG tablet   Oral   Take 70 mg by mouth every 7 (seven) days. Take with a full glass of water on an empty stomach.         . ALPRAZolam (XANAX) 1 MG tablet   Oral   Take 0.5 mg by mouth 3 (three) times daily as needed for anxiety.         . Calcium Carbonate-Vitamin D (CALCIUM 600 + D PO)   Oral   Take 1 tablet by mouth daily.         . Cholecalciferol (VITAMIN D-3) 1000 UNITS CAPS   Oral   Take 2,000 Units by mouth daily.         . fish oil-omega-3 fatty acids 1000 MG capsule   Oral   Take 2 g by mouth daily.         . hydrochlorothiazide (MICROZIDE) 12.5 MG capsule   Oral   Take 12.5 mg by mouth daily.         . niacin 500 MG tablet   Oral   Take 500 mg by mouth every evening.         Marland Kitchen aspirin EC 325 MG EC tablet   Oral   Take 1 tablet (325 mg total) by mouth daily.   30 tablet   0   . diltiazem (CARDIZEM LA) 240 MG 24 hr tablet   Oral   Take 1 tablet (240 mg total) by mouth daily.  30 tablet   1   . niacin 500 MG tablet   Oral   Take 1 tablet (500 mg total) by mouth daily with breakfast.   30 tablet   0   . omega-3 acid ethyl esters (LOVAZA) 1 G capsule   Oral   Take 2 capsules (2 g total) by mouth daily.   30 capsule   0     BP 130/84  Pulse 76  Temp(Src) 97.9 F (36.6 C) (Oral)  Resp 18  Ht 5\' 5"  (1.651 m)  Wt 147 lb 14.4 oz (67.087 kg)  BMI 24.61 kg/m2  SpO2 94%  Physical Exam  Nursing note and vitals reviewed. Constitutional:  Awake, alert, nontoxic appearance.  HENT:  Head: Atraumatic.  Eyes: Right eye exhibits no discharge. Left eye exhibits no discharge.  Neck: Neck supple.  Cardiovascular: Normal rate and regular rhythm.   No murmur heard. Pulmonary/Chest: Effort normal and breath sounds normal. No respiratory distress. She has no wheezes. She has no rales. She exhibits no tenderness.  Abdominal: Soft. Bowel sounds are normal. She exhibits no distension. There is no tenderness. There is no rebound and no guarding.  Musculoskeletal: She exhibits no edema and no tenderness.  Baseline ROM, no obvious new focal weakness.  Neurological: She  is alert.  Mental status and motor strength appears baseline for patient and situation.  Skin: No rash noted.  Psychiatric: She has a normal mood and affect.    ED Course  Procedures (including critical care time) ECG: Normal sinus rhythm, ventricular rate 93, normal axis, normal intervals, no acute ischemic changes noted, no comparison ECG available  Patient / Family / Caregiver understand and agree with initial ED impression and plan with expectations set for ED visit.Pt stable in ED with no significant deterioration in condition.D/w [facility] for [facility].    Labs Reviewed  CBC WITH DIFFERENTIAL - Abnormal; Notable for the following:    Monocytes Relative 14 (*)    All other components within normal limits  BASIC METABOLIC PANEL - Abnormal; Notable for the following:    Glucose, Bld 107 (*)    Creatinine, Ser 0.49 (*)    All other components within normal limits  CBC  TSH  HEMOGLOBIN A1C  TROPONIN I  TROPONIN I  POCT I-STAT, CHEM 8  POCT I-STAT TROPONIN I   No results found.   1. Chest pain   2. HTN (hypertension)   3. Hyperlipidemia       MDM  The patient appears reasonably stabilized for admission considering the current resources, flow, and capabilities available in the ED at this time, and I doubt any other [facility] requiring further screening and/or treatment in the ED prior to admission.        Hurman Horn, MD 06/03/12 [phone]

## 2012-05-30 ENCOUNTER — Ambulatory Visit (HOSPITAL_COMMUNITY)
Admission: RE | Admit: 2012-05-30 | Discharge: 2012-05-30 | Disposition: A | Discharge status: Home / Self Care | Payer: 59 | Source: Ambulatory Visit | Attending: Cardiovascular Disease | Admitting: Cardiovascular Disease

## 2012-05-30 DIAGNOSIS — Z01818 Encounter for other preprocedural examination: Secondary | ICD-10-CM | POA: Insufficient documentation

## 2012-05-30 DIAGNOSIS — R002 Palpitations: Secondary | ICD-10-CM | POA: Insufficient documentation

## 2012-05-30 DIAGNOSIS — R0602 Shortness of breath: Secondary | ICD-10-CM | POA: Insufficient documentation

## 2012-05-30 DIAGNOSIS — R6884 Jaw pain: Secondary | ICD-10-CM | POA: Insufficient documentation

## 2012-05-30 DIAGNOSIS — R079 Chest pain, unspecified: Secondary | ICD-10-CM | POA: Insufficient documentation

## 2012-05-30 MED ORDER — REGADENOSON 0.4 MG/5ML IV SOLN
0.4000 mg | Freq: Once | INTRAVENOUS | Status: AC
  Administered 2012-05-30: 0.4 mg via INTRAVENOUS

## 2012-05-30 MED ORDER — TECHNETIUM TC 99M SESTAMIBI GENERIC - CARDIOLITE
10.0000 | Freq: Once | INTRAVENOUS | Status: AC | PRN
  Administered 2012-05-30: 10 via INTRAVENOUS

## 2012-05-30 MED ORDER — TECHNETIUM TC 99M SESTAMIBI GENERIC - CARDIOLITE
30.0000 | Freq: Once | INTRAVENOUS | Status: AC | PRN
  Administered 2012-05-30: 30 via INTRAVENOUS

## 2012-05-30 NOTE — Procedures (Addendum)
[facility] [address] [place] [address] [phone]  Cardiology Nuclear Med Study  Kristina Sloan is a 67 y.o. female     MRN : [id number]     DOB: 06-Sep-1945  Procedure Date: 05/30/2012  Nuclear Med Background Indication for Stress Test:  Evaluation for Ischemia, Surgical Clearance and Post Hospital History:  no prior cardiac history Cardiac Risk Factors: History of Smoking, Hypertension and Lipids  Symptoms:  Palpitations, SOB and Chest pain, pressure, tightness, jaw pain   Nuclear Pre-Procedure Caffeine/Decaff Intake:  1:00am NPO After: 11:00am   IV Site: R Antecubital  IV 0.9% NS with Angio Cath:  22g  Chest Size (in):  n/a IV Started by: Koren Shiver, CNMT  Height: 5\' 5"  (1.651 m)  Cup Size: 36DD  BMI:  Body mass index is 24.46 kg/(m^2). Weight:  147 lb (66.679 kg)   Tech Comments:  Changed to Lexiscan due to elevated BP    Nuclear Med Study 1 or 2 day study: 1 day  Stress Test Type:  Stress  Order Authorizing Provider:  Lollie Marrow   Resting Radionuclide: Technetium 46m Sestamibi  Resting Radionuclide Dose: 10.3 mCi   Stress Radionuclide:  Technetium 69m Sestamibi  Stress Radionuclide Dose: 30.5 mCi           Stress Protocol Rest HR: 95 Stress HR: 123  Rest BP: 157/101 Stress BP: 170/105  Exercise Time (min): n/a METS: n/a   Predicted Max HR: 156 bpm % Max HR: 60.9 bpm Rate Pressure Product: 21308  Dose of Adenosine (mg):  n/a Dose of Lexiscan: 0.4 mg  Dose of Atropine (mg): n/a Dose of Dobutamine: n/a mcg/kg/min (at max HR)  Stress Test Technologist: Esperanza Sheets, CCT Nuclear Technologist: Koren Shiver, CNMT   Rest Procedure:  Myocardial perfusion imaging was performed at rest 45 minutes following the intravenous administration of Technetium 38m Sestamibi. Stress Procedure:  The patient received IV Lexiscan 0.4 mg over 15-seconds.  Technetium 14m Sestamibi injected at 30-seconds.   There were no significant changes with Lexiscan.  Quantitative spect images were obtained after a 45 minute delay.  Transient Ischemic Dilatation (Normal <1.22):  1.10 Lung/Heart Ratio (Normal <0.45):  0.21 QGS EDV:  43 ml QGS ESV:  11 ml LV Ejection Fraction: 75%  Signed by       Rest ECG: NSR - Normal EKG  Stress ECG: No significant change from baseline ECG  QPS Raw Data Images:  Normal; no motion artifact; normal heart/lung ratio. Stress Images:  Normal homogeneous uptake in all areas of the myocardium. Rest Images:  Normal homogeneous uptake in all areas of the myocardium. Subtraction (SDS):  No evidence of ischemia.  Impression Exercise Capacity:  Lexiscan with no exercise. BP Response:  Normal blood pressure response. Clinical Symptoms:  No significant symptoms noted. ECG Impression:  No significant ST segment change suggestive of ischemia. Comparison with Prior Nuclear Study: No significant change from previous study  Overall Impression:  Normal stress nuclear study.  LV Wall Motion:  NL LV Function; NL Wall Motion   Runell Gess, MD  05/30/2012 5:37 PM

## 2012-06-04 ENCOUNTER — Telehealth: Payer: Self-pay | Admitting: Cardiology

## 2012-06-04 NOTE — Telephone Encounter (Signed)
Mrs. Sadiq is call ing for her stress test results .Marland Kitchen Please call her at work @ [phone]

## 2012-06-04 NOTE — Telephone Encounter (Signed)
Message forwarded to S. Martin, RN.  

## 2012-06-05 NOTE — Telephone Encounter (Signed)
Need her test results from stress test-scheduled for surgery on 06-12-12! Does she need to see Dr Herbie Baltimore before Monday?

## 2012-06-05 NOTE — Telephone Encounter (Signed)
Message forwarded to S. Martin, RN.  

## 2012-06-07 ENCOUNTER — Telehealth: Payer: Self-pay | Admitting: *Deleted

## 2012-06-07 NOTE — Telephone Encounter (Signed)
Left message to call back  

## 2012-06-10 ENCOUNTER — Telehealth: Payer: Self-pay | Admitting: *Deleted

## 2012-06-10 NOTE — Telephone Encounter (Signed)
Returning your call from Friday-Need the test results this morning asap!

## 2012-06-10 NOTE — Telephone Encounter (Signed)
Spoke to patient. Result given. Will have scheduler  Call and make an appointment for next week

## 2012-06-10 NOTE — Telephone Encounter (Signed)
Spoke to patient . Results given. appt next week of June 9th  with  Dr Herbie Baltimore

## 2012-06-17 ENCOUNTER — Ambulatory Visit: Payer: 59 | Admitting: Cardiology

## 2012-11-14 ENCOUNTER — Other Ambulatory Visit: Payer: Self-pay

## 2013-04-25 ENCOUNTER — Other Ambulatory Visit (HOSPITAL_COMMUNITY): Payer: Self-pay | Admitting: Family Medicine

## 2013-04-25 DIAGNOSIS — Z0000 Encounter for general adult medical examination without abnormal findings: Secondary | ICD-10-CM

## 2013-04-25 DIAGNOSIS — Z79899 Other long term (current) drug therapy: Secondary | ICD-10-CM

## 2013-04-30 ENCOUNTER — Ambulatory Visit (HOSPITAL_COMMUNITY)
Admission: RE | Admit: 2013-04-30 | Discharge: 2013-04-30 | Disposition: A | Discharge status: Home / Self Care | Payer: Medicare Other | Source: Ambulatory Visit | Attending: Family Medicine | Admitting: Family Medicine

## 2013-04-30 DIAGNOSIS — Z0000 Encounter for general adult medical examination without abnormal findings: Secondary | ICD-10-CM

## 2013-04-30 DIAGNOSIS — Z79899 Other long term (current) drug therapy: Secondary | ICD-10-CM

## 2013-06-10 ENCOUNTER — Telehealth: Payer: Self-pay

## 2013-06-10 NOTE — Telephone Encounter (Signed)
Pt is calling to set up TCS. Her call back number is [phone]

## 2013-06-17 NOTE — Telephone Encounter (Signed)
Pt's last colonoscopy was 05/10/2004 by Dr. Gala Romney.  Her next was to be in 5 years for hihg rish screening. She has a personal hx of breast cancer.  I called and LMOM for a return call.

## 2013-07-01 ENCOUNTER — Telehealth: Payer: Self-pay

## 2013-07-01 NOTE — Telephone Encounter (Signed)
See separate triage.  

## 2013-07-10 NOTE — Telephone Encounter (Signed)
Appropriate.

## 2013-07-10 NOTE — Telephone Encounter (Signed)
Gastroenterology Pre-Procedure Review  Request Date: 07/01/2013 Requesting Physician: Dr. Ethlyn Gallery  PT HAD COLONOSCOPY 05/10/2004 BY Dr. Gala Romney and next was to be in 5 years Pt has personal hx of breast cancer Pt drinks 2 glasses of white wine daily  PATIENT REVIEW QUESTIONS: The patient responded to the following health history questions as indicated:    1. Diabetes Melitis: no 2. Joint replacements in the past 12 months: no 3. Major health problems in the past 3 months: no 4. Has an artificial valve or MVP: no 5. Has a defibrillator: no 6. Has been advised in past to take antibiotics in advance of a procedure like teeth cleaning: no    MEDICATIONS & ALLERGIES:    Patient reports the following regarding taking any blood thinners:   Plavix? no Aspirin? YES Coumadin? no  Patient confirms/reports the following medications:  Current Outpatient Prescriptions  Medication Sig Dispense Refill  . alendronate (FOSAMAX) 70 MG tablet Take 70 mg by mouth every 7 (seven) days. Take with a full glass of water on an empty stomach.      . ALPRAZolam (XANAX) 1 MG tablet Take 0.5 mg by mouth 3 (three) times daily as needed for anxiety.      Marland Kitchen aspirin 81 MG tablet Take 81 mg by mouth daily.      . Calcium Carbonate-Vitamin D (CALCIUM 600 + D PO) Take 1 tablet by mouth daily.      . Cholecalciferol (VITAMIN D-3) 1000 UNITS CAPS Take 2,000 Units by mouth daily.      Marland Kitchen diltiazem (CARDIZEM LA) 240 MG 24 hr tablet Take 1 tablet (240 mg total) by mouth daily.  30 tablet  1  . fish oil-omega-3 fatty acids 1000 MG capsule Take 2 g by mouth daily.      . hydrochlorothiazide (MICROZIDE) 12.5 MG capsule Take 12.5 mg by mouth daily.      . niacin 500 MG tablet Take 500 mg by mouth every evening.      . NON FORMULARY Align     One tablet daily       No current facility-administered medications for this visit.    Patient confirms/reports the following allergies:  No Known Allergies  No orders of the defined  types were placed in this encounter.    AUTHORIZATION INFORMATION Primary Insurance:   ID #:  Group #:  Pre-Cert / Auth required: Pre-Cert / Auth #:   Secondary Insurance:  ID #:Group #:  Pre-Cert / Auth required:  Pre-Cert / Auth #:   SCHEDULE INFORMATION: Procedure has been scheduled as follows:  Date:                        Time:   Location:   This Gastroenterology Pre-Precedure Review Form is being routed to the following provider(s): R. Garfield Cornea, MD

## 2013-07-16 NOTE — Telephone Encounter (Signed)
LMOM for pt to call to get the appt for colonoscopy.

## 2013-07-21 ENCOUNTER — Other Ambulatory Visit: Payer: Self-pay

## 2013-07-21 DIAGNOSIS — Z1211 Encounter for screening for malignant neoplasm of colon: Secondary | ICD-10-CM

## 2013-07-21 MED ORDER — PEG-KCL-NACL-NASULF-NA ASC-C 100 G PO SOLR: 1.0000 | ORAL | Status: DC

## 2013-07-21 NOTE — Telephone Encounter (Signed)
Rx sent to the pharmacy and instructions left at front for pt to pick up today.

## 2013-07-25 ENCOUNTER — Other Ambulatory Visit: Payer: Self-pay | Admitting: Dermatology

## 2013-07-28 ENCOUNTER — Encounter (HOSPITAL_COMMUNITY): Payer: Self-pay | Admitting: Pharmacy Technician

## 2013-07-29 ENCOUNTER — Ambulatory Visit (HOSPITAL_COMMUNITY)
Admission: RE | Admit: 2013-07-29 | Discharge: 2013-07-29 | Disposition: A | Discharge status: Home / Self Care | Payer: Medicare Other | Source: Ambulatory Visit | Attending: Internal Medicine | Admitting: Internal Medicine

## 2013-07-29 ENCOUNTER — Encounter (HOSPITAL_COMMUNITY): Payer: Self-pay | Admitting: *Deleted

## 2013-07-29 ENCOUNTER — Encounter (HOSPITAL_COMMUNITY)
Admission: RE | Disposition: A | Discharge status: Home / Self Care | Payer: Self-pay | Source: Ambulatory Visit | Attending: Internal Medicine

## 2013-07-29 DIAGNOSIS — K5730 Diverticulosis of large intestine without perforation or abscess without bleeding: Secondary | ICD-10-CM | POA: Insufficient documentation

## 2013-07-29 DIAGNOSIS — Z853 Personal history of malignant neoplasm of breast: Secondary | ICD-10-CM | POA: Insufficient documentation

## 2013-07-29 DIAGNOSIS — Z7982 Long term (current) use of aspirin: Secondary | ICD-10-CM | POA: Insufficient documentation

## 2013-07-29 DIAGNOSIS — I10 Essential (primary) hypertension: Secondary | ICD-10-CM | POA: Insufficient documentation

## 2013-07-29 DIAGNOSIS — Z79899 Other long term (current) drug therapy: Secondary | ICD-10-CM | POA: Insufficient documentation

## 2013-07-29 DIAGNOSIS — Z87891 Personal history of nicotine dependence: Secondary | ICD-10-CM | POA: Insufficient documentation

## 2013-07-29 DIAGNOSIS — Z86010 Personal history of colonic polyps: Secondary | ICD-10-CM

## 2013-07-29 DIAGNOSIS — D126 Benign neoplasm of colon, unspecified: Secondary | ICD-10-CM | POA: Insufficient documentation

## 2013-07-29 DIAGNOSIS — Z1211 Encounter for screening for malignant neoplasm of colon: Secondary | ICD-10-CM | POA: Insufficient documentation

## 2013-07-29 DIAGNOSIS — Q438 Other specified congenital malformations of intestine: Secondary | ICD-10-CM | POA: Insufficient documentation

## 2013-07-29 HISTORY — PX: COLONOSCOPY: SHX5424

## 2013-07-29 SURGERY — COLONOSCOPY
Anesthesia: Moderate Sedation

## 2013-07-29 MED ORDER — MIDAZOLAM HCL 5 MG/5ML IJ SOLN
INTRAMUSCULAR | Status: DC | PRN
  Administered 2013-07-29 (×2): 2 mg via INTRAVENOUS
  Administered 2013-07-29 (×2): 1 mg via INTRAVENOUS

## 2013-07-29 MED ORDER — MEPERIDINE HCL 100 MG/ML IJ SOLN
INTRAMUSCULAR | Status: AC
  Filled 2013-07-29: qty 2

## 2013-07-29 MED ORDER — MIDAZOLAM HCL 5 MG/5ML IJ SOLN
INTRAMUSCULAR | Status: AC
  Filled 2013-07-29: qty 10

## 2013-07-29 MED ORDER — ONDANSETRON HCL 4 MG/2ML IJ SOLN
INTRAMUSCULAR | Status: AC
  Filled 2013-07-29: qty 2

## 2013-07-29 MED ORDER — ONDANSETRON HCL 4 MG/2ML IJ SOLN
INTRAMUSCULAR | Status: DC | PRN
  Administered 2013-07-29: 4 mg via INTRAVENOUS

## 2013-07-29 MED ORDER — STERILE WATER FOR IRRIGATION IR SOLN
Status: DC | PRN
  Administered 2013-07-29: 09:00:00

## 2013-07-29 MED ORDER — MEPERIDINE HCL 100 MG/ML IJ SOLN
INTRAMUSCULAR | Status: DC | PRN
  Administered 2013-07-29 (×2): 50 mg via INTRAVENOUS
  Administered 2013-07-29: 25 mg via INTRAVENOUS

## 2013-07-29 NOTE — Discharge Instructions (Addendum)
Colonoscopy Discharge Instructions  Read the instructions outlined below and refer to this sheet in the next few weeks. These discharge instructions provide you with general information on caring for yourself after you leave the hospital. Your doctor may also give you specific instructions. While your treatment has been planned according to the most current medical practices available, unavoidable complications occasionally occur. If you have any problems or questions after discharge, call Dr. Gala Romney at [phone]. ACTIVITY  You may resume your regular activity, but move at a slower pace for the next 24 hours.   Take frequent rest periods for the next 24 hours.   Walking will help get rid of the air and reduce the bloated feeling in your belly (abdomen).   No driving for 24 hours (because of the medicine (anesthesia) used during the test).    Do not sign any important legal documents or operate any machinery for 24 hours (because of the anesthesia used during the test).  NUTRITION  Drink plenty of fluids.   You may resume your normal diet as instructed by your doctor.   Begin with a light meal and progress to your normal diet. Heavy or fried foods are harder to digest and may make you feel sick to your stomach (nauseated).   Avoid alcoholic beverages for 24 hours or as instructed.  MEDICATIONS  You may resume your normal medications unless your doctor tells you otherwise.  WHAT YOU CAN EXPECT TODAY  Some feelings of bloating in the abdomen.   Passage of more gas than usual.   Spotting of blood in your stool or on the toilet paper.  IF YOU HAD POLYPS REMOVED DURING THE COLONOSCOPY:  No aspirin products for 7 days or as instructed.   No alcohol for 7 days or as instructed.   Eat a soft diet for the next 24 hours.  FINDING OUT THE RESULTS OF YOUR TEST Not all test results are available during your visit. If your test results are not back during the visit, make an appointment  with your caregiver to find out the results. Do not assume everything is normal if you have not heard from your caregiver or the medical facility. It is important for you to follow up on all of your test results.  SEEK IMMEDIATE MEDICAL ATTENTION IF:  You have more than a spotting of blood in your stool.   Your belly is swollen (abdominal distention).   You are nauseated or vomiting.   You have a temperature over 101.   You have abdominal pain or discomfort that is severe or gets worse throughout the day.   Colon Polyps Polyps are lumps of extra tissue growing inside the body. Polyps can grow in the large intestine (colon). Most colon polyps are noncancerous (benign). However, some colon polyps can become cancerous over time. Polyps that are larger than a pea may be harmful. To be safe, caregivers remove and test all polyps. CAUSES  Polyps form when mutations in the genes cause your cells to grow and divide even though no more tissue is needed. RISK FACTORS There are a number of risk factors that can increase your chances of getting colon polyps. They include:  Being older than 50 years.  Family history of colon polyps or colon cancer.  Long-term colon diseases, such as colitis or Crohn disease.  Being overweight.  Smoking.  Being inactive.  Drinking too much alcohol. SYMPTOMS  Most small polyps do not cause symptoms. If symptoms are present, they may include:  Blood in the stool. The stool may look dark red or black.  Constipation or diarrhea that lasts longer than 1 week. DIAGNOSIS People often do not know they have polyps until their caregiver finds them during a regular checkup. Your caregiver can use 4 tests to check for polyps:  Digital rectal exam. The caregiver wears gloves and feels inside the rectum. This test would find polyps only in the rectum.  Barium enema. The caregiver puts a liquid called barium into your rectum before taking X-rays of your colon. Barium  makes your colon look white. Polyps are dark, so they are easy to see in the X-ray pictures.  Sigmoidoscopy. A thin, flexible tube (sigmoidoscope) is placed into your rectum. The sigmoidoscope has a light and tiny camera in it. The caregiver uses the sigmoidoscope to look at the last third of your colon.  Colonoscopy. This test is like sigmoidoscopy, but the caregiver looks at the entire colon. This is the most common method for finding and removing polyps. TREATMENT  Any polyps will be removed during a sigmoidoscopy or colonoscopy. The polyps are then tested for cancer. PREVENTION  To help lower your risk of getting more colon polyps:  Eat plenty of fruits and vegetables. Avoid eating fatty foods.  Do not smoke.  Avoid drinking alcohol.  Exercise every day.  Lose weight if recommended by your caregiver.  Eat plenty of calcium and folate. Foods that are rich in calcium include milk, cheese, and broccoli. Foods that are rich in folate include chickpeas, kidney beans, and spinach. HOME CARE INSTRUCTIONS Keep all follow-up appointments as directed by your caregiver. You may need periodic exams to check for polyps. SEEK MEDICAL CARE IF: You notice bleeding during a bowel movement.  Diverticulosis Diverticulosis is the condition that develops when small pouches (diverticula) form in the wall of your colon. Your colon, or large intestine, is where water is absorbed and stool is formed. The pouches form when the inside layer of your colon pushes through weak spots in the outer layers of your colon. CAUSES  No one knows exactly what causes diverticulosis. RISK FACTORS  Being older than 59. Your risk for this condition increases with age. Diverticulosis is rare in people younger than 40 years. By age 1, almost everyone has it.  Eating a low-fiber diet.  Being frequently constipated.  Being overweight.  Not getting enough exercise.  Smoking.  Taking over-the-counter pain medicines,  like aspirin and ibuprofen. SYMPTOMS  Most people with diverticulosis do not have symptoms. DIAGNOSIS  Because diverticulosis often has no symptoms, health care providers often discover the condition during an exam for other colon problems. In many cases, a health care provider will diagnose diverticulosis while using a flexible scope to examine the colon (colonoscopy). TREATMENT  If you have never developed an infection related to diverticulosis, you may not need treatment. If you have had an infection before, treatment may include:  Eating more fruits, vegetables, and grains.  Taking a fiber supplement.  Taking a live bacteria supplement (probiotic).  Taking medicine to relax your colon. HOME CARE INSTRUCTIONS   Drink at least 6-8 glasses of water each day to prevent constipation.  Try not to strain when you have a bowel movement.  Keep all follow-up appointments. If you have had an infection before:  Increase the fiber in your diet as directed by your health care provider or dietitian.  Take a dietary fiber supplement if your health care provider approves.  Only take medicines as directed by  your health care provider. SEEK MEDICAL CARE IF:   You have abdominal pain.  You have bloating.  You have cramps.  You have not gone to the bathroom in 3 days. SEEK IMMEDIATE MEDICAL CARE IF:   Your pain gets worse.  Yourbloating becomes very bad.  You have a fever or chills, and your symptoms suddenly get worse.  You begin vomiting.  You have bowel movements that are bloody or black. MAKE SURE YOU:  Understand these instructions.  Will watch your condition.  Will get help right away if you are not doing well or get worse.    Polyp and diverticulosis information provided  Further recommendations to follow pending review of pathology report

## 2013-07-29 NOTE — Op Note (Signed)
[facility] [address] [place], [address]   COLONOSCOPY PROCEDURE REPORT  PATIENT: Kristina Sloan, Kristina Sloan  MR#:         [id number] BIRTHDATE: [birth year] , 11  yrs. old GENDER: Female ENDOSCOPIST: R.  Garfield Cornea, MD FACP FACG REFERRED BY:  Micheline Rough, M.D. PROCEDURE DATE:  07/29/2013 PROCEDURE:     Colonoscopy with biopsy  INDICATIONS: Colonoscopy with biopsy  INFORMED CONSENT:  The risks, benefits, alternatives and imponderables including but not limited to bleeding, perforation as well as the possibility of a missed lesion have been reviewed.  The potential for biopsy, lesion removal, etc. have also been discussed.  Questions have been answered.  All parties agreeable. Please see the history and physical in the medical record for more information.  MEDICATIONS: Versed 6 mg IV and Demerol 125 mg IV in divided doses. Zofran 4 mg IV  DESCRIPTION OF PROCEDURE:  After a digital rectal exam was performed, the EC-3890Li ([id number])  colonoscope was advanced from the anus through the rectum and colon to the area of the cecum, ileocecal valve and appendiceal orifice.  The cecum was deeply intubated.  These structures were well-seen and photographed for the record.  From the level of the cecum and ileocecal valve, the scope was slowly and cautiously withdrawn.  The mucosal surfaces were carefully surveyed utilizing scope tip deflection to facilitate fold flattening as needed.  The scope was pulled down into the rectum where a thorough examination including retroflexion was performed.    FINDINGS:  Adequate preparation. Normal rectum. Redundant colon. Sigmoid diverticula; (1) diminutive polyp at the hepatic flexure; otherwise, the remainder of the colonic mucosa appeared normal.  THERAPEUTIC / DIAGNOSTIC MANEUVERS PERFORMED:  The above-mentioned polyps cold biopsied/removed  COMPLICATIONS: none  CECAL WITHDRAWAL TIME:  7 minutes  IMPRESSION:    Colonic   Diverticulosis. Colonic polyp-removed as described above  RECOMMENDATIONS: Followup on pathology.   _______________________________ eSigned:  R. Garfield Cornea, MD FACP [facility] 07/29/2013 10:17 AM   CC:    PATIENT NAME:  Patrina, Andreas MR#: [id number]

## 2013-07-29 NOTE — H&P (Signed)
_0 @   Primary Care Physician:  Purvis Kilts, MD Primary Gastroenterologist:  Dr. Gala Romney  Pre-Procedure History & Physical: HPI:  Kristina Sloan is a 68 y.o. female is here for a screening colonoscopy. No bowel symptoms. No family history of colon polyps or colon cancer. No personal history of colon polyps. Last colonoscopy almost 10 years ago.    Past Medical History  Diagnosis Date  . Hard of hearing   . Complication of anesthesia     DIFFICULT WAKING ONLY ONCE  . Hypertension   . Chest pain 05/23/2012  . Cancer     BREAST    Past Surgical History  Procedure Laterality Date  . Eye surgery    . Tubal ligation    . Breast surgery      LUMPECTOMY    Prior to Admission medications   Medication Sig Start Date End Date Taking? Authorizing Provider  alendronate (FOSAMAX) 70 MG tablet Take 70 mg by mouth every 7 (seven) days. Take with a full glass of water on an empty stomach.   Yes Historical Provider, MD  ALPRAZolam Duanne Moron) 1 MG tablet Take 0.5 mg by mouth 3 (three) times daily as needed for anxiety.   Yes Historical Provider, MD  aspirin 81 MG tablet Take 81 mg by mouth daily.   Yes Historical Provider, MD  Calcium Carbonate-Vitamin D (CALCIUM 600 + D PO) Take 1 tablet by mouth daily.   Yes Historical Provider, MD  Cholecalciferol (VITAMIN D-3) 1000 UNITS CAPS Take 2,000 Units by mouth daily.   Yes Historical Provider, MD  Coenzyme Q10 (CO Q 10) 100 MG CAPS Take 1 capsule by mouth daily.   Yes Historical Provider, MD  diltiazem (CARDIZEM LA) 240 MG 24 hr tablet Take 1 tablet (240 mg total) by mouth daily. 05/25/12  Yes Dianne Dun, NP  fish oil-omega-3 fatty acids 1000 MG capsule Take 2 g by mouth daily.   Yes Historical Provider, MD  hydrochlorothiazide (MICROZIDE) 12.5 MG capsule Take 12.5 mg by mouth daily.   Yes Historical Provider, MD  niacin 500 MG tablet Take 500 mg by mouth every evening.   Yes Historical Provider, MD  NON FORMULARY Align     One  tablet daily   Yes Historical Provider, MD  peg 3350 powder (MOVIPREP) 100 G SOLR Take 1 kit (200 g total) by mouth as directed. 07/21/13  Yes Daneil Dolin, MD    Allergies as of 07/21/2013  . (No Known Allergies)    History reviewed. No pertinent family history.  History   Social History  . Marital Status: Single    Spouse Name: N/A    Number of Children: N/A  . Years of Education: N/A   Occupational History  . Not on file.   Social History Main Topics  . Smoking status: Former Smoker    Quit date: 01/09/1990  . Smokeless tobacco: Never Used  . Alcohol Use: Yes     Comment: q pm, white wine  . Drug Use: No  . Sexual Activity: Not on file   Other Topics Concern  . Not on file   Social History Narrative  . No narrative on file    Review of Systems: See HPI, otherwise negative ROS  Physical Exam: BP 143/91  Pulse 81  Temp(Src) 97.7 F (36.5 C) (Oral)  Ht _1  (1.651 m)  Wt 150 lb (68.04 kg)  BMI 24.96 kg/m2  SpO2 95% General:   Alert,  Well-developed, well-nourished, pleasant and  cooperative in NAD Head:  Normocephalic and atraumatic. Eyes:  Sclera clear, no icterus.   Conjunctiva pink. Ears:  Normal auditory acuity. Nose:  No deformity, discharge,  or lesions. Mouth:  No deformity or lesions, dentition normal. Neck:  Supple; no masses or thyromegaly. Lungs:  Clear throughout to auscultation.   No wheezes, crackles, or rhonchi. No acute distress. Heart:  Regular rate and rhythm; no murmurs, clicks, rubs,  or gallops. Abdomen:  Soft, nontender and nondistended. No masses, hepatosplenomegaly or hernias noted. Normal bowel sounds, without guarding, and without rebound.   Msk:  Symmetrical without gross deformities. Normal posture. Pulses:  Normal pulses noted.   Impression:  Kristina Sloan is now here to undergo a screening colonoscopy.  Average risk screening examination Risks, benefits, limitations, imponderables and alternatives regarding colonoscopy  have been reviewed with the patient. Questions have been answered. All parties agreeable.     Notice:  This dictation was prepared with Dragon dictation along with smaller phrase technology. Any transcriptional errors that result from this process are unintentional and may not be corrected upon review.

## 2013-08-01 ENCOUNTER — Encounter: Payer: Self-pay | Admitting: Internal Medicine

## 2013-08-01 ENCOUNTER — Encounter (HOSPITAL_COMMUNITY): Payer: Self-pay | Admitting: Internal Medicine

## 2013-08-06 ENCOUNTER — Telehealth: Payer: Self-pay | Admitting: *Deleted

## 2013-08-06 NOTE — Telephone Encounter (Signed)
Patient called yesterday evening and LMOM stating she had a colonoscopy last Tuesday by Dr. Gala Romney, pt said she has not gotten any results and she also looked on mychart and she said there was nothing on there about her colonoscopy results. Please advise [phone]

## 2013-08-06 NOTE — Telephone Encounter (Signed)
pts letter has been mailed. I spoke with pt today and informed her of results. She said to let RMR know that she woke up during her procedure and she said she felt everything going on. She said she is not sure if she will ever have another tcs again or not.

## 2013-08-14 NOTE — Telephone Encounter (Signed)
Please let patient know that we gave her conscious sedation not "un-conscious" sedation. It was not general anesthesia. She was comfortable during the procedure which is the important thing.  Please tell her that we can provide her with propofol next time. She should not avoid a future procedure because of this concern. Of course, it's all about patient perception. Tell her it will be better next time. Thanks.

## 2013-08-21 NOTE — Telephone Encounter (Signed)
Pt is aware and she said she appreciates Dr.Rourks concern. She said she will think about it and let us know when she becomes due for a tcs again if she plans on having another one.

## 2014-06-07 ENCOUNTER — Emergency Department (HOSPITAL_COMMUNITY)
Admission: EM | Admit: 2014-06-07 | Discharge: 2014-06-07 | Disposition: A | Discharge status: Home / Self Care | Payer: Medicare Other | Source: Home / Self Care | Attending: Internal Medicine | Admitting: Internal Medicine

## 2014-06-07 ENCOUNTER — Encounter (HOSPITAL_COMMUNITY): Payer: Self-pay | Admitting: *Deleted

## 2014-06-07 DIAGNOSIS — R51 Headache: Secondary | ICD-10-CM

## 2014-06-07 DIAGNOSIS — R519 Headache, unspecified: Secondary | ICD-10-CM

## 2014-06-07 DIAGNOSIS — R509 Fever, unspecified: Secondary | ICD-10-CM

## 2014-06-07 MED ORDER — OSELTAMIVIR PHOSPHATE 75 MG PO CAPS: 75.0000 mg | ORAL_CAPSULE | Freq: Two times a day (BID) | ORAL | Status: DC

## 2014-06-07 MED ORDER — KETOROLAC TROMETHAMINE 30 MG/ML IJ SOLN
INTRAMUSCULAR | Status: AC
  Filled 2014-06-07: qty 1

## 2014-06-07 MED ORDER — KETOROLAC TROMETHAMINE 30 MG/ML IJ SOLN
30.0000 mg | Freq: Once | INTRAMUSCULAR | Status: AC
Start: 2014-06-07 — End: 2014-06-07
  Administered 2014-06-07: 30 mg via INTRAMUSCULAR

## 2014-06-07 NOTE — ED Provider Notes (Signed)
CSN: [id number]     Arrival date & time 06/07/14  1962 History   First MD Initiated Contact with Patient 06/07/14 1053     Chief Complaint  Patient presents with  . Headache   HPI  Patient reports onset of headache, neck and back achiness, fever, 3 days ago, on May 26. No runny, congested nose. Sore throat resolved, after Z-Pak prescribed 5 days ago for pharyngitis in [place]. Not coughing. No vomiting, no diarrhea. Tends to be a little constipated. Mild nausea, appetite decreased. No sustained abdominal discomfort, had a couple of twinges in the left lower quadrant yesterday, that she attributed to tendency to mild constipation. Had a flu shot in September 2015.  Past Medical History  Diagnosis Date  . Hard of hearing   . Complication of anesthesia     DIFFICULT WAKING ONLY ONCE  . Hypertension   . Chest pain 05/23/2012  . Cancer     BREAST   Past Surgical History  Procedure Laterality Date  . Eye surgery    . Tubal ligation    . Breast surgery      LUMPECTOMY  . Colonoscopy N/A 07/29/2013    Procedure: COLONOSCOPY;  Surgeon: Daneil Dolin, MD;  Location: [facility];  Service: Endoscopy;  Laterality: N/A;  2:00 PM-moved to 915 Pt notified of time change   History reviewed. No pertinent family history. History  Substance Use Topics  . Smoking status: Former Smoker    Quit date: 01/09/1990  . Smokeless tobacco: Never Used  . Alcohol Use: Yes     Comment: q pm, white wine   OB History    No data available     Review of Systems  All other systems reviewed and are negative.   Allergies  Review of patient's allergies indicates no known allergies.  Home Medications   Prior to Admission medications   Medication Sig Start Date End Date Taking? Authorizing Provider  alendronate (FOSAMAX) 70 MG tablet Take 70 mg by mouth every 7 (seven) days. Take with a full glass of water on an empty stomach.    Historical Provider, MD  ALPRAZolam Duanne Moron) 1 MG tablet Take 0.5 mg by  mouth 3 (three) times daily as needed for anxiety.    Historical Provider, MD  aspirin 81 MG tablet Take 81 mg by mouth daily.    Historical Provider, MD  Calcium Carbonate-Vitamin D (CALCIUM 600 + D PO) Take 1 tablet by mouth daily.    Historical Provider, MD  Cholecalciferol (VITAMIN D-3) 1000 UNITS CAPS Take 2,000 Units by mouth daily.    Historical Provider, MD  Coenzyme Q10 (CO Q 10) 100 MG CAPS Take 1 capsule by mouth daily.    Historical Provider, MD  diltiazem (CARDIZEM LA) 240 MG 24 hr tablet Take 1 tablet (240 mg total) by mouth daily. 05/25/12   Dianne Dun, NP  fish oil-omega-3 fatty acids 1000 MG capsule Take 2 g by mouth daily.    Historical Provider, MD  hydrochlorothiazide (MICROZIDE) 12.5 MG capsule Take 12.5 mg by mouth daily.    Historical Provider, MD  niacin 500 MG tablet Take 500 mg by mouth every evening.    Historical Provider, MD  NON FORMULARY Align     One tablet daily    Historical Provider, MD  peg 3350 powder (MOVIPREP) 100 G SOLR Take 1 kit (200 g total) by mouth as directed. 07/21/13   Daneil Dolin, MD   BP 129/73 mmHg  Pulse 113  Temp(Src) 100.3 F (37.9 C) (Oral)  Resp 16  SpO2 97% Physical Exam  Constitutional: She is oriented to person, place, and time. No distress.  Alert, nicely groomed  HENT:  Head: Atraumatic.  Bilateral TMs dull, no erythema Moderate nasal congestion, no mucopurulent material present Mildly red throat  Eyes:  Conjugate gaze, no eye redness/drainage  Neck: Neck supple.  Cardiovascular: Normal rate and regular rhythm.   Pulmonary/Chest: No respiratory distress. She has no wheezes. She has no rales.  Lungs clear, symmetric breath sounds  Abdominal: She exhibits no distension.  Musculoskeletal: Normal range of motion.  No leg swelling  Neurological: She is alert and oriented to person, place, and time.  Face is symmetric, speech is clear/coherent Patient was able to walk into facility independently, and climb onto  exam table  Skin: Skin is warm and dry.  No cyanosis  Nursing note and vitals reviewed.   ED Course  Procedures (including critical care time) Labs Review Labs Reviewed - No data to display  Imaging Review No results found.   MDM   1. Febrile illness, acute   2. Frontal headache    possible late case of influenza, trial of Tamiflu. Rest and push fluids. Continue Tylenol and Aleve for Advil for headache. Anticipate gradual improvement over the next 10-14 days. Follow-up PCP/Dr. Hilma Favors in 2 or 3 days for persistent headache/fever.    Sherlene Shams, MD 06/07/14 [phone]

## 2014-06-07 NOTE — ED Notes (Signed)
Pt  Reports   Symptoms  Of  Fever    Headache        Body  Aches           Pain  When  She  Looks  Down        Pt   Seen  Recently  In   [place]          For pharyngitis   And    Just finished  A  z  Pack

## 2014-06-07 NOTE — Discharge Instructions (Signed)
Possible late case of influenza, prescription for tamiflu sent to [facility] on [address].   Push fluids, rest. Tylenol or advil/aleve for achiness/headache/fever.  Ice bag may help headache also. Followup with PCP/Dr Hilma Favors in 2-3 days if headache/fever not improving.  Viral Infections A viral infection can be caused by different types of viruses.Most viral infections are not serious and resolve on their own. However, some infections may cause severe symptoms and may lead to further complications. SYMPTOMS Viruses can frequently cause:  Minor sore throat.  Aches and pains.  Headaches.  Runny nose.  Different types of rashes.  Watery eyes.  Tiredness.  Cough.  Loss of appetite.  Gastrointestinal infections, resulting in nausea, vomiting, and diarrhea. These symptoms do not respond to antibiotics because the infection is not caused by bacteria. However, you might catch a bacterial infection following the viral infection. This is sometimes called a "superinfection." Symptoms of such a bacterial infection may include:  Worsening sore throat with pus and difficulty swallowing.  Swollen neck glands.  Chills and a high or persistent fever.  Severe headache.  Tenderness over the sinuses.  Persistent overall ill feeling (malaise), muscle aches, and tiredness (fatigue).  Persistent cough.  Yellow, green, or brown mucus production with coughing. HOME CARE INSTRUCTIONS   Only take over-the-counter or prescription medicines for pain, discomfort, diarrhea, or fever as directed by your caregiver.  Drink enough water and fluids to keep your urine clear or pale yellow. Sports drinks can provide valuable electrolytes, sugars, and hydration.  Get plenty of rest and maintain proper nutrition. Soups and broths with crackers or rice are fine. SEEK IMMEDIATE MEDICAL CARE IF:   You have severe headaches, shortness of breath, chest pain, neck pain, or an unusual rash.  You have  uncontrolled vomiting, diarrhea, or you are unable to keep down fluids.  You or your child has an oral temperature above 102 F (38.9 C), not controlled by medicine.  Your baby is older than 3 months with a rectal temperature of 102 F (38.9 C) or higher.  Your baby is 68 months old or younger with a rectal temperature of 100.4 F (38 C) or higher. MAKE SURE YOU:   Understand these instructions.  Will watch your condition.  Will get help right away if you are not doing well or get worse. Document Released: 10/05/2004 Document Revised: 03/20/2011 Document Reviewed: 05/02/2010 [facility] Patient Information 2015 [place], [place]. This information is not intended to replace advice given to you by your health care provider. Make sure you discuss any questions you have with your health care provider.

## 2014-07-06 ENCOUNTER — Other Ambulatory Visit: Payer: Self-pay

## 2015-08-16 ENCOUNTER — Ambulatory Visit (HOSPITAL_COMMUNITY)
Admission: RE | Admit: 2015-08-16 | Discharge: 2015-08-16 | Disposition: A | Discharge status: Home / Self Care | Payer: Medicare Other | Source: Ambulatory Visit | Attending: Family Medicine | Admitting: Family Medicine

## 2015-08-16 ENCOUNTER — Other Ambulatory Visit (HOSPITAL_COMMUNITY): Payer: Self-pay | Admitting: Family Medicine

## 2015-08-16 DIAGNOSIS — Z139 Encounter for screening, unspecified: Secondary | ICD-10-CM | POA: Diagnosis not present

## 2015-08-25 ENCOUNTER — Encounter (HOSPITAL_BASED_OUTPATIENT_CLINIC_OR_DEPARTMENT_OTHER): Payer: Self-pay

## 2015-08-25 ENCOUNTER — Ambulatory Visit (HOSPITAL_BASED_OUTPATIENT_CLINIC_OR_DEPARTMENT_OTHER): Admit: 2015-08-25 | Payer: Medicare Other | Admitting: Otolaryngology

## 2015-08-25 SURGERY — INSERTION, BONE ANCHORED HEARING AID
Anesthesia: General | Laterality: Left

## 2016-01-10 HISTORY — PX: COCHLEAR IMPLANT: SUR684

## 2016-01-24 ENCOUNTER — Ambulatory Visit (HOSPITAL_COMMUNITY)
Admission: RE | Admit: 2016-01-24 | Discharge: 2016-01-24 | Disposition: A | Discharge status: Home / Self Care | Payer: Medicare Other | Source: Ambulatory Visit | Attending: Family Medicine | Admitting: Family Medicine

## 2016-01-24 ENCOUNTER — Other Ambulatory Visit (HOSPITAL_COMMUNITY): Payer: Self-pay | Admitting: Family Medicine

## 2016-01-24 DIAGNOSIS — J069 Acute upper respiratory infection, unspecified: Secondary | ICD-10-CM | POA: Diagnosis present

## 2016-01-24 DIAGNOSIS — R5383 Other fatigue: Secondary | ICD-10-CM | POA: Diagnosis present

## 2016-01-24 DIAGNOSIS — R918 Other nonspecific abnormal finding of lung field: Secondary | ICD-10-CM | POA: Diagnosis not present

## 2016-02-15 ENCOUNTER — Other Ambulatory Visit (HOSPITAL_COMMUNITY): Payer: Self-pay | Admitting: Family Medicine

## 2016-02-15 DIAGNOSIS — J189 Pneumonia, unspecified organism: Secondary | ICD-10-CM

## 2016-02-21 ENCOUNTER — Encounter (HOSPITAL_COMMUNITY): Payer: Self-pay

## 2016-02-21 ENCOUNTER — Ambulatory Visit (HOSPITAL_COMMUNITY)
Admission: RE | Admit: 2016-02-21 | Discharge: 2016-02-21 | Disposition: A | Discharge status: Home / Self Care | Payer: Medicare Other | Source: Ambulatory Visit | Attending: Family Medicine | Admitting: Family Medicine

## 2016-02-21 DIAGNOSIS — J189 Pneumonia, unspecified organism: Secondary | ICD-10-CM | POA: Diagnosis present

## 2016-02-21 DIAGNOSIS — R918 Other nonspecific abnormal finding of lung field: Secondary | ICD-10-CM | POA: Diagnosis not present

## 2016-02-21 LAB — POCT I-STAT CREATININE: CREATININE: 0.6 mg/dL (ref 0.44–1.00)

## 2016-02-21 MED ORDER — IOPAMIDOL (ISOVUE-300) INJECTION 61%
75.0000 mL | Freq: Once | INTRAVENOUS | Status: AC | PRN
  Administered 2016-02-21: 75 mL via INTRAVENOUS

## 2016-02-24 ENCOUNTER — Other Ambulatory Visit (HOSPITAL_COMMUNITY): Payer: Self-pay | Admitting: Family Medicine

## 2016-02-24 DIAGNOSIS — R918 Other nonspecific abnormal finding of lung field: Secondary | ICD-10-CM

## 2016-03-07 ENCOUNTER — Encounter (HOSPITAL_COMMUNITY)
Admission: RE | Admit: 2016-03-07 | Discharge: 2016-03-07 | Disposition: A | Discharge status: Home / Self Care | Payer: Medicare Other | Source: Ambulatory Visit | Attending: Family Medicine | Admitting: Family Medicine

## 2016-03-07 DIAGNOSIS — R918 Other nonspecific abnormal finding of lung field: Secondary | ICD-10-CM | POA: Diagnosis not present

## 2016-03-07 LAB — GLUCOSE, CAPILLARY: Glucose-Capillary: 112 mg/dL — ABNORMAL HIGH (ref 65–99)

## 2016-03-07 MED ORDER — FLUDEOXYGLUCOSE F - 18 (FDG) INJECTION
7.3500 | Freq: Once | INTRAVENOUS | Status: AC | PRN
  Administered 2016-03-07: 7.35 via INTRAVENOUS

## 2016-03-10 ENCOUNTER — Other Ambulatory Visit (HOSPITAL_COMMUNITY): Payer: Self-pay | Admitting: Family Medicine

## 2016-03-13 ENCOUNTER — Telehealth: Payer: Self-pay

## 2016-03-13 NOTE — Telephone Encounter (Signed)
Made patient an appt on 03/15/16 after someone cancelled I have called and left a msg on pt's voicemail and sent fax back to Dr Hilma Favors

## 2016-03-15 ENCOUNTER — Ambulatory Visit (INDEPENDENT_AMBULATORY_CARE_PROVIDER_SITE_OTHER): Payer: Medicare Other | Admitting: Pulmonary Disease

## 2016-03-15 ENCOUNTER — Encounter: Payer: Self-pay | Admitting: Pulmonary Disease

## 2016-03-15 DIAGNOSIS — R911 Solitary pulmonary nodule: Secondary | ICD-10-CM | POA: Insufficient documentation

## 2016-03-15 DIAGNOSIS — IMO0001 Reserved for inherently not codable concepts without codable children: Secondary | ICD-10-CM | POA: Insufficient documentation

## 2016-03-15 DIAGNOSIS — F411 Generalized anxiety disorder: Secondary | ICD-10-CM | POA: Insufficient documentation

## 2016-03-15 NOTE — Addendum Note (Signed)
Addended by: Tyson Dense on: 03/15/2016 05:02 PM   Modules accepted: Orders

## 2016-03-15 NOTE — Patient Instructions (Signed)
   I will see you back after your lung biopsy.  Please call me if you have any new breathing problems or questions before your next appointment.

## 2016-03-15 NOTE — Progress Notes (Signed)
Subjective:    Patient ID: Kristina Sloan, female    DOB: Aug 31, 1945, 71 y.o.   MRN: [id number]   HPI The patient reports she had an X-ray in January after she had "walking pneumonia". She was found to have a right lower lobe opacity that prompted CT imaging and without clearing patient had a PET CT scan that showed hypermetabolic activity within the same lesion. She reports her previous fever and chills resolved. She reports every morning she feels like "there is some congestion" and she coughs up a clear phlegm. She reports she has no cough during the rest of the day. Denies any hemoptysis. No chest pain or pressure. She does have intermittent chest tightness that she attributes to her panic attacks. No pleuritic chest pain. She reports she has had some weight loss since retiring but this seems to have leveled off. No dysphagia or odynophagia. No reflux, dyspepsia, or morning brash water taste. Patient had XRT to her right breast in 2004 after a lumpectomy but had no chemotherapy.   Review of Systems She reports she has had an intermittent headache more frequently lately. No focal vision changes. No focal weakness, numbness, or tingling. A pertinent 14 point review of systems is negative except as per the history of presenting illness.  No Known Allergies  Current Outpatient Prescriptions on File Prior to Visit  Medication Sig Dispense Refill  . ALPRAZolam (XANAX) 1 MG tablet Take 0.5 mg by mouth 3 (three) times daily as needed for anxiety.    Marland Kitchen aspirin 81 MG tablet Take 81 mg by mouth daily.    . Calcium Carbonate-Vitamin D (CALCIUM 600 + D PO) Take 1 tablet by mouth daily.    . Cholecalciferol (VITAMIN D-3) 1000 UNITS CAPS Take 2,000 Units by mouth daily.    . Coenzyme Q10 (CO Q 10) 100 MG CAPS Take 1 capsule by mouth daily.    Marland Kitchen diltiazem (CARDIZEM LA) 240 MG 24 hr tablet Take 1 tablet (240 mg total) by mouth daily. 30 tablet 1  . fish oil-omega-3 fatty acids 1000 MG capsule Take 2 g by  mouth daily.    . hydrochlorothiazide (MICROZIDE) 12.5 MG capsule Take 12.5 mg by mouth daily.    . NON FORMULARY Align     One tablet daily    . alendronate (FOSAMAX) 70 MG tablet Take 70 mg by mouth every 7 (seven) days. Take with a full glass of water on an empty stomach.    . niacin 500 MG tablet Take 500 mg by mouth every evening.    Marland Kitchen oseltamivir (TAMIFLU) 75 MG capsule Take 1 capsule (75 mg total) by mouth every 12 (twelve) hours. (Patient not taking: Reported on 03/15/2016) 10 capsule 0  . peg 3350 powder (MOVIPREP) 100 G SOLR Take 1 kit (200 g total) by mouth as directed. (Patient not taking: Reported on 03/15/2016) 1 kit 0   No current facility-administered medications on file prior to visit.     Past Medical History:  Diagnosis Date  . Cancer [facility]) 2003   rt breast 2003 lumpectomy/rad tx  . Chest pain 05/23/2012  . Complication of anesthesia    DIFFICULT WAKING ONLY ONCE  . Hard of hearing   . Hypertension     Past Surgical History:  Procedure Laterality Date  . BREAST SURGERY     LUMPECTOMY  . COLONOSCOPY N/A 07/29/2013   Procedure: COLONOSCOPY;  Surgeon: Daneil Dolin, MD;  [facility];  Service: Endoscopy;  Laterality:  N/A;  2:00 PM-moved to 915 Pt notified of time change  . EYE SURGERY    . TUBAL LIGATION      Family History  Problem Relation Age of Onset  . COPD Mother   . Bladder Cancer Father   . Bladder Cancer Brother     Social History   Social History  . Marital status: Single    Spouse name: N/A  . Number of children: N/A  . Years of education: N/A   Social History Main Topics  . Smoking status: Former Smoker    Packs/day: 1.00    Years: 25.00    Types: Cigarettes    Start date: 01/09/1965    Quit date: 01/09/1990  . Smokeless tobacco: Never Used  . Alcohol use Yes     Comment: q pm, white wine  . Drug use: No  . Sexual activity: Not Asked   Other Topics Concern  . None   Social History Narrative    Pulmonary (03/15/16):    Originally from [place]. She has lived in [place] since 1951. She grew up with her mother. She has traveled to [place] (in June), [place], [place], [place], [place], [place], & multiple [place] states. 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 No bird or mold exposure. No recent hot tub exposure. Enjoys photography.       Objective:   Physical Exam BP 140/90 (BP Location: Left Arm, Patient Position: Sitting, Cuff Size: Normal)   Pulse 89   Ht _0  (1.651 m)   Wt 148 lb 6.4 oz (67.3 kg)   SpO2 97%   BMI 24.70 kg/m  General:  Awake. Alert. No acute distress.  Integument:  Warm & dry. No rash on exposed skin. No bruising. Extremities:  No cyanosis or clubbing.  Lymphatics:  No appreciated cervical or supraclavicular lymphadenoapthy. HEENT:  Moist mucus membranes. No oral ulcers. No scleral injection or icterus.  Cardiovascular:  Regular rate and rhythm. No edema. No appreciable JVD.  Pulmonary:  Good aeration & clear to auscultation bilaterally. Symmetric chest wall expansion. No accessory muscle use. Abdomen: Soft. Normal bowel sounds. Nondistended.  Musculoskeletal:  Normal bulk and tone. No joint deformity or effusion appreciated. Neurological:  CN 2-12 grossly in tact. No meningismus. Moving all 4 extremities equally.  Psychiatric:  Mood and affect congruent. Speech normal rhythm, rate & tone.   IMAGING PET CT 03/07/16 (personally reviewed by me):  Right lower lobe subpleural patchy opacity with hypermetabolic activity in maximum SUV 6.0. No other hypermetabolic activity noted. No pathologic mediastinal or hilar adenopathy. No pleural effusion or thickening. No pericardial effusion.  CXR PA/LAT 08/16/15 (personally reviewed by me):  No focal opacity appreciated. No pleural effusion. Heart & mediastinum normal in size & contour.     Assessment & Plan:  71 y.o. female with hypermetabolic right lower lobe nodule. With her history of infection in January I do question whether  or not this could represent a resolving inflammatory process as she was still on antibiotic therapy in February around the time of her CT scan and just prior to her PET CT scan. However, given her family history of malignancy and her personal history of tobacco use a lung cancer is certainly possible. She has no symptoms of an occult infectious process at this time. We discussed simply repeat his CT scan in approximately 4 weeks to monitor for signs of progression or regression versus performing a lung biopsy. Given the very peripheral nature of this  lesion I do feel that a CT-guided biopsy would be the best to access the lesion itself. The patient wishes to be aggressive and is agreeable to a lung biopsy. I did briefly discuss the risks of the procedure including bleeding, infection, pneumothorax.  1. Hypermetabolic right lung nodule: Ordering CT-guided lung biopsy of this nodule. Plan to follow-up on patient post pathology. Further resection depending upon pathology result. Given her history of tobacco use she will need pulmonary function tests prior to referral to thoracic surgery if necessary. 2. Follow-up: Patient to return to clinic in 4 weeks.  Sonia Baller Ashok Cordia, M.D. [facility] Pulmonary [facility] Critical [facility] Pager:  [phone] After 3pm or if no response, call [phone] 4:44 PM 03/15/16

## 2016-03-16 ENCOUNTER — Other Ambulatory Visit: Payer: Self-pay | Admitting: Pulmonary Disease

## 2016-03-16 DIAGNOSIS — IMO0001 Reserved for inherently not codable concepts without codable children: Secondary | ICD-10-CM

## 2016-03-16 DIAGNOSIS — R911 Solitary pulmonary nodule: Secondary | ICD-10-CM

## 2016-03-27 ENCOUNTER — Other Ambulatory Visit: Payer: Self-pay | Admitting: Radiology

## 2016-03-28 ENCOUNTER — Ambulatory Visit (HOSPITAL_COMMUNITY)
Admission: RE | Admit: 2016-03-28 | Discharge: 2016-03-28 | Disposition: A | Discharge status: Home / Self Care | Payer: Medicare Other | Source: Ambulatory Visit | Attending: Pulmonary Disease | Admitting: Pulmonary Disease

## 2016-03-28 ENCOUNTER — Other Ambulatory Visit: Payer: Self-pay | Admitting: Pulmonary Disease

## 2016-03-28 ENCOUNTER — Encounter (HOSPITAL_COMMUNITY): Payer: Self-pay

## 2016-03-28 DIAGNOSIS — Z9889 Other specified postprocedural states: Secondary | ICD-10-CM | POA: Insufficient documentation

## 2016-03-28 DIAGNOSIS — Z7982 Long term (current) use of aspirin: Secondary | ICD-10-CM | POA: Diagnosis not present

## 2016-03-28 DIAGNOSIS — Z8052 Family history of malignant neoplasm of bladder: Secondary | ICD-10-CM | POA: Insufficient documentation

## 2016-03-28 DIAGNOSIS — I700 Atherosclerosis of aorta: Secondary | ICD-10-CM | POA: Insufficient documentation

## 2016-03-28 DIAGNOSIS — Z853 Personal history of malignant neoplasm of breast: Secondary | ICD-10-CM | POA: Insufficient documentation

## 2016-03-28 DIAGNOSIS — Z87891 Personal history of nicotine dependence: Secondary | ICD-10-CM | POA: Insufficient documentation

## 2016-03-28 DIAGNOSIS — K8020 Calculus of gallbladder without cholecystitis without obstruction: Secondary | ICD-10-CM | POA: Insufficient documentation

## 2016-03-28 DIAGNOSIS — Z825 Family history of asthma and other chronic lower respiratory diseases: Secondary | ICD-10-CM | POA: Insufficient documentation

## 2016-03-28 DIAGNOSIS — R911 Solitary pulmonary nodule: Secondary | ICD-10-CM

## 2016-03-28 DIAGNOSIS — I708 Atherosclerosis of other arteries: Secondary | ICD-10-CM | POA: Insufficient documentation

## 2016-03-28 DIAGNOSIS — I10 Essential (primary) hypertension: Secondary | ICD-10-CM | POA: Insufficient documentation

## 2016-03-28 DIAGNOSIS — IMO0001 Reserved for inherently not codable concepts without codable children: Secondary | ICD-10-CM

## 2016-03-28 DIAGNOSIS — Z923 Personal history of irradiation: Secondary | ICD-10-CM | POA: Insufficient documentation

## 2016-03-28 DIAGNOSIS — Z79899 Other long term (current) drug therapy: Secondary | ICD-10-CM | POA: Insufficient documentation

## 2016-03-28 DIAGNOSIS — R918 Other nonspecific abnormal finding of lung field: Secondary | ICD-10-CM | POA: Diagnosis not present

## 2016-03-28 LAB — CBC
HCT: 41.3 % (ref 36.0–46.0)
Hemoglobin: 13.9 g/dL (ref 12.0–15.0)
MCH: 30.3 pg (ref 26.0–34.0)
MCHC: 33.7 g/dL (ref 30.0–36.0)
MCV: 90.2 fL (ref 78.0–100.0)
PLATELETS: 228 10*3/uL (ref 150–400)
RBC: 4.58 MIL/uL (ref 3.87–5.11)
RDW: 14 % (ref 11.5–15.5)
WBC: 5.2 10*3/uL (ref 4.0–10.5)

## 2016-03-28 LAB — PROTIME-INR
INR: 1.02
Prothrombin Time: 13.5 seconds (ref 11.4–15.2)

## 2016-03-28 LAB — APTT: aPTT: 28 seconds (ref 24–36)

## 2016-03-28 MED ORDER — SODIUM CHLORIDE 0.9 % IV SOLN
INTRAVENOUS | Status: DC
Start: 2016-03-28 — End: 2016-03-29

## 2016-03-28 NOTE — H&P (Signed)
Chief Complaint: Patient was seen in consultation today for right lung mass biopsy at the request of Nestor,Jennings E  Referring Physician(s): Nestor,Jennings E  Supervising Physician: Markus Daft  Patient Status: [facility] - Out-pt  History of Present Illness: Kristina Sloan is a 71 y.o. female   Hx R breast Ca 2004- radiation treatment Diagnosed with PNA 01/2016 with noted RLL opacity. Non clearing after treatment for PNA Work up ensued  2/27 PET: The irregular nodularity posteriorly in the right lower lobe has a maximum SUV of 6.0, suspicious for malignancy. Tissue diagnosis recommended. No adenopathy identified.  Now scheduled for biopsy of same   Past Medical History:  Diagnosis Date  . Cancer [facility]) 2003   rt breast 2003 lumpectomy/rad tx  . Chest pain 05/23/2012  . Complication of anesthesia    DIFFICULT WAKING ONLY ONCE  . Hard of hearing   . Hypertension     Past Surgical History:  Procedure Laterality Date  . BREAST SURGERY     LUMPECTOMY  . COLONOSCOPY N/A 07/29/2013   Procedure: COLONOSCOPY;  Surgeon: Daneil Dolin, MD;  [facility];  Service: Endoscopy;  Laterality: N/A;  2:00 PM-moved to 915 Pt notified of time change  . EYE SURGERY    . TUBAL LIGATION      Allergies: Patient has no known allergies.  Medications: Prior to Admission medications   Medication Sig Start Date End Date Taking? Authorizing Provider  ALPRAZolam Duanne Moron) 1 MG tablet Take 0.5 mg by mouth 3 (three) times daily as needed for anxiety.   Yes Historical Provider, MD  aspirin 81 MG tablet Take 81 mg by mouth daily.   Yes Historical Provider, MD  Calcium Carbonate-Vitamin D (CALCIUM 600 + D PO) Take 1 tablet by mouth daily.   Yes Historical Provider, MD  Coenzyme Q10 (CO Q 10) 100 MG CAPS Take 100 mg by mouth daily.    Yes Historical Provider, MD  diltiazem (CARDIZEM LA) 240 MG 24 hr tablet Take 1 tablet (240 mg total) by mouth daily. 05/25/12  Yes Dianne Dun,  NP  fish oil-omega-3 fatty acids 1000 MG capsule Take 2,000 mg by mouth daily.    Yes Historical Provider, MD  hydrochlorothiazide (HYDRODIURIL) 25 MG tablet Take 12.5 mg by mouth daily. 03/06/16  Yes Historical Provider, MD  Probiotic Product (PROBIOTIC DAILY PO) Take 1 tablet by mouth daily.   Yes Historical Provider, MD  oseltamivir (TAMIFLU) 75 MG capsule Take 1 capsule (75 mg total) by mouth every 12 (twelve) hours. Patient not taking: Reported on 03/15/2016 06/07/14   Sherlene Shams, MD  peg 3350 powder (MOVIPREP) 100 G SOLR Take 1 kit (200 g total) by mouth as directed. Patient not taking: Reported on 03/15/2016 07/21/13   Daneil Dolin, MD     Family History  Problem Relation Age of Onset  . COPD Mother   . Bladder Cancer Father   . Bladder Cancer Brother     Social History   Social History  . Marital status: Single    Spouse name: N/A  . Number of children: N/A  . Years of education: N/A   Social History Main Topics  . Smoking status: Former Smoker    Packs/day: 1.00    Years: 25.00    Types: Cigarettes    Start date: 01/09/1965    Quit date: 01/09/1990  . Smokeless tobacco: Never Used  . Alcohol use Yes     Comment: q pm, white wine  .  Drug use: No  . Sexual activity: Not Asked   Other Topics Concern  . None   Social History Narrative   [facility] (03/15/16):   Originally from [place]. She has lived in [place] since 1951. She grew up with her mother. She has traveled to [place] (in June), [place], [place], [place], [place], [place], & multiple [place] states. 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 No bird or mold exposure. No recent hot tub exposure. Enjoys photography.     Review of Systems: A 12 point ROS discussed and pertinent positives are indicated in the HPI above.  All other systems are negative.  Review of Systems  Constitutional: Negative for activity change, fatigue and fever.  Respiratory: Negative for cough and shortness of breath.    Gastrointestinal: Negative for abdominal pain.  Neurological: Negative for weakness.  Psychiatric/Behavioral: Negative for behavioral problems and confusion.    Vital Signs: BP (!) 150/90   Pulse 86   Temp 97.6 F (36.4 C)   Resp 18   Ht 5' 5" (1.651 m)   Wt 145 lb (65.8 kg)   SpO2 96%   BMI 24.13 kg/m   Physical Exam  Constitutional: She is oriented to person, place, and time.  Cardiovascular: Normal rate, regular rhythm and normal heart sounds.   Pulmonary/Chest: Effort normal and breath sounds normal.  Abdominal: Soft. Bowel sounds are normal.  Musculoskeletal: Normal range of motion.  Neurological: She is alert and oriented to person, place, and time.  Skin: Skin is warm and dry.  Psychiatric: She has a normal mood and affect. Her behavior is normal. Judgment and thought content normal.  Nursing note and vitals reviewed.   Mallampati Score:  MD Evaluation Airway: WNL Heart: WNL Abdomen: WNL Chest/ Lungs: WNL ASA  Classification: 3 Mallampati/Airway Score: One  Imaging: Nm Pet Image Initial (pi) Skull Base To Thigh  Addendum Date: 03/13/2016   ADDENDUM REPORT: 03/13/2016 09:28 ADDENDUM: There was a transcription error in the impression of the original report. Hypermetabolic irregular nodularity is in the right lower lobe of the lung, not the right lobe of the liver. Impression item #1 should read The irregular nodularity posteriorly in the right lower lobe has a maximum SUV of 6.0, suspicious for malignancy. Tissue diagnosis recommended. No adenopathy identified. Findings were discussed with Dr. Hilma Favors via telephone at the time of this addendum. Electronically Signed   By: Jacqulynn Cadet M.D.   On: 03/13/2016 09:28   Result Date: 03/13/2016 CLINICAL DATA:  Initial treatment strategy for lung nodules. EXAM: NUCLEAR MEDICINE PET SKULL BASE TO THIGH TECHNIQUE: 7.4 mCi F-18 FDG was injected intravenously. Full-ring PET imaging was performed from the skull base to thigh  after the radiotracer. CT data was obtained and used for attenuation correction and anatomic localization. FASTING BLOOD GLUCOSE:  Value: 112 mg/dl COMPARISON:  02/21/2016 CT scan FINDINGS: NECK No hypermetabolic lymph nodes in the neck. Left lateral occipital craniectomy. CHEST Irregular right posterior 2.0 by 1.1 cm opacity in the lower lobe has nodular components with a similar configuration to the CT scan from 02/21/2016. This lesion is hypermetabolic with a maximum SUV of 6.0. Coronary, aortic arch, and branch vessel atherosclerotic vascular disease. Clips in the lateral portion of the right breast. No pathologic adenopathy. ABDOMEN/PELVIS No abnormal hypermetabolic activity within the liver, pancreas, adrenal glands, or spleen. No hypermetabolic lymph nodes in the abdomen or pelvis. Small calcification along the right hepatic lobe capsule posteriorly, image 86/4. Gallstone in the gallbladder  measures 1.2 cm in diameter on image 100/4. SKELETON No focal hypermetabolic activity to suggest skeletal metastasis. IMPRESSION: 1. The irregular nodularity posteriorly in the right hepatic lobe has a maximum SUV of 6.0, suspicious for malignancy. Tissue diagnosis recommended. No adenopathy identified. 2. Other imaging findings of potential clinical significance: Coronary, aortic arch, and branch vessel atherosclerotic vascular disease. Cholelithiasis. Electronically Signed: By: Van Clines M.D. On: 03/07/2016 11:31    Labs:  CBC:  Recent Labs  03/28/16 0923  WBC 5.2  HGB 13.9  HCT 41.3  PLT 228    COAGS:  Recent Labs  03/28/16 0923  INR 1.02  APTT 28    BMP:  Recent Labs  02/21/16 1715  CREATININE 0.60    LIVER FUNCTION TESTS: No results for input(s): BILITOT, AST, ALT, ALKPHOS, PROT, ALBUMIN in the last 8760 hours.  TUMOR MARKERS: No results for input(s): AFPTM, CEA, CA199, CHROMGRNA in the last 8760 hours.  Assessment and Plan:  Right lung mass +PET Hx Breast Ca Now  scheduled for biopsy Risks and Benefits discussed with the patient including, but not limited to bleeding, hemoptysis, respiratory failure requiring intubation, infection, pneumothorax requiring chest tube placement, stroke from air embolism or even death. All of the patient's questions were answered, patient is agreeable to proceed. Consent signed and in chart.   Thank you for this interesting consult.  I greatly enjoyed meeting QUANETTA TRUSS and look forward to participating in their care.  A copy of this report was sent to the requesting provider on this date.  Electronically Signed: Monia Sabal A 03/28/2016, 10:16 AM   I spent a total of  30 Minutes   in face to face in clinical consultation, greater than 50% of which was counseling/coordinating care for right lung mass biopsy

## 2016-03-28 NOTE — Sedation Documentation (Signed)
Biopsy cancelled per Dr. Anselm Pancoast. Pt D/C home with friend. Awake and alert. In no distress.

## 2016-03-29 ENCOUNTER — Other Ambulatory Visit: Payer: Self-pay | Admitting: Pulmonary Disease

## 2016-03-29 DIAGNOSIS — IMO0001 Reserved for inherently not codable concepts without codable children: Secondary | ICD-10-CM

## 2016-03-29 DIAGNOSIS — R911 Solitary pulmonary nodule: Secondary | ICD-10-CM

## 2016-04-05 NOTE — Progress Notes (Signed)
Received message from [facility] who stated the patient wanted to know if it was okay to wait until June to have CT performed. Spoke with JN who said it was okay to wait until then. Message was relayed to [facility] about holding off. Nothing further is needed.

## 2016-04-17 ENCOUNTER — Ambulatory Visit: Payer: Medicare Other | Admitting: Pulmonary Disease

## 2016-04-19 ENCOUNTER — Other Ambulatory Visit: Payer: Self-pay | Admitting: Pulmonary Disease

## 2016-04-19 ENCOUNTER — Ambulatory Visit (INDEPENDENT_AMBULATORY_CARE_PROVIDER_SITE_OTHER): Payer: Medicare Other | Admitting: Pulmonary Disease

## 2016-04-19 DIAGNOSIS — IMO0001 Reserved for inherently not codable concepts without codable children: Secondary | ICD-10-CM

## 2016-04-19 DIAGNOSIS — R918 Other nonspecific abnormal finding of lung field: Secondary | ICD-10-CM

## 2016-04-19 DIAGNOSIS — R911 Solitary pulmonary nodule: Secondary | ICD-10-CM

## 2016-04-19 NOTE — Progress Notes (Signed)
Rescheduled to following CT scan.

## 2016-06-09 ENCOUNTER — Ambulatory Visit (HOSPITAL_COMMUNITY)
Admission: RE | Admit: 2016-06-09 | Discharge: 2016-06-09 | Disposition: A | Discharge status: Home / Self Care | Payer: Medicare Other | Source: Ambulatory Visit | Attending: Pulmonary Disease | Admitting: Pulmonary Disease

## 2016-06-09 DIAGNOSIS — R918 Other nonspecific abnormal finding of lung field: Secondary | ICD-10-CM | POA: Insufficient documentation

## 2016-06-09 DIAGNOSIS — R911 Solitary pulmonary nodule: Secondary | ICD-10-CM | POA: Diagnosis present

## 2016-06-09 DIAGNOSIS — K8020 Calculus of gallbladder without cholecystitis without obstruction: Secondary | ICD-10-CM | POA: Diagnosis not present

## 2016-06-09 DIAGNOSIS — I700 Atherosclerosis of aorta: Secondary | ICD-10-CM | POA: Insufficient documentation

## 2016-06-09 DIAGNOSIS — IMO0001 Reserved for inherently not codable concepts without codable children: Secondary | ICD-10-CM

## 2016-06-19 ENCOUNTER — Telehealth: Payer: Self-pay | Admitting: Pulmonary Disease

## 2016-06-19 NOTE — Progress Notes (Signed)
LMTCB

## 2016-06-19 NOTE — Telephone Encounter (Signed)
Notes recorded by Javier Glazier, MD on 06/16/2016 at 8:05 PM EDT Please let the patient know that I reviewed her CT scan. The area in her right lower lung is continuing to regress and has nearly completely resolved. We will review it further at her follow-up appointment. Thanks. -------------------------------------- Spoke with pt. She is aware of results. Nothing further was needed.

## 2016-06-27 NOTE — Progress Notes (Signed)
Subjective:    Patient ID: Kristina Sloan, female    DOB: [birth year], 71 y.o.   MRN: [id number]   C.C.:  Follow-up for Right Lower Lobe Lung Nodule.   HPI Right lower lobe nodule: Hypermetabolic on PET/CT imaging. Progressively resolving with repeat imaging.Patient reports minimal early morning cough. Denies any wheezing or difficulty breathing. No new chest pain or pressure. Does have some mild right scapular discomfort after "sawing tree limbs" yesterday.  Review of Systems No subjective fever or chills. No abdominal pain or nausea. No headache or vision changes.  No Known Allergies  Current Outpatient Prescriptions on File Prior to Visit  Medication Sig Dispense Refill  . ALPRAZolam (XANAX) 1 MG tablet Take 0.5 mg by mouth 3 (three) times daily as needed for anxiety.    Marland Kitchen aspirin 81 MG tablet Take 81 mg by mouth daily.    . Calcium Carbonate-Vitamin D (CALCIUM 600 + D PO) Take 1 tablet by mouth daily.    . Coenzyme Q10 (CO Q 10) 100 MG CAPS Take 100 mg by mouth daily.     Marland Kitchen diltiazem (CARDIZEM LA) 240 MG 24 hr tablet Take 1 tablet (240 mg total) by mouth daily. 30 tablet 1  . fish oil-omega-3 fatty acids 1000 MG capsule Take 2,000 mg by mouth daily.     . hydrochlorothiazide (HYDRODIURIL) 25 MG tablet Take 12.5 mg by mouth daily.    . Probiotic Product (PROBIOTIC DAILY PO) Take 1 tablet by mouth daily.     No current facility-administered medications on file prior to visit.     Past Medical History:  Diagnosis Date  . Cancer [facility]) 2003   rt breast 2003 lumpectomy/rad tx  . Chest pain 05/23/2012  . Complication of anesthesia    DIFFICULT WAKING ONLY ONCE  . Hard of hearing   . Hypertension     Past Surgical History:  Procedure Laterality Date  . BREAST SURGERY     LUMPECTOMY  . COLONOSCOPY N/A 07/29/2013   Procedure: COLONOSCOPY;  Surgeon: Daneil Dolin, MD;  [facility];  Service: Endoscopy;  Laterality: N/A;  2:00 PM-moved to 915 Pt notified of time change    . EYE SURGERY    . TUBAL LIGATION      Family History  Problem Relation Age of Onset  . COPD Mother   . Bladder Cancer Father   . Bladder Cancer Brother     Social History   Social History  . Marital status: Single    Spouse name: N/A  . Number of children: N/A  . Years of education: N/A   Social History Main Topics  . Smoking status: Former Smoker    Packs/day: 1.00    Years: 25.00    Types: Cigarettes    Start date: 01/09/1965    Quit date: 01/09/1990  . Smokeless tobacco: Never Used  . Alcohol use Yes     Comment: q pm, white wine  . Drug use: No  . Sexual activity: Not Asked   Other Topics Concern  . None   Social History Narrative   [employer] [facility] (03/15/16):   Originally from [place]. She has lived in [place] since 1951. She grew up with her mother. She has traveled to [place] (in June), [place], [place], [place], [place], [place], & multiple [place] states. 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 No bird or mold exposure. No recent hot tub exposure. Enjoys photography.  Objective:   Physical Exam BP 120/74 (BP Location: Left Arm, Cuff Size: Normal)   Pulse 80   Ht 5\' 5"  (1.651 m)   Wt 147 lb (66.7 kg)   SpO2 98%   BMI 24.46 kg/m   General:  Awake. Alert. No distress.  Integument:  Warm & dry. No rash on exposed skin. No bruising on exposed skin. Extremities:  No cyanosis or clubbing.  HEENT:  Moist mucus membranes. No oral ulcers. No scleral injection or icterus.  Cardiovascular:  Regular rate. No edema. Normal S1 & S2.  Pulmonary:  Good aeration & clear to auscultation bilaterally. Symmetric chest wall expansion. No accessory muscle use on room air. Abdomen: Soft. Normal bowel sounds. Nondistended.  Musculoskeletal:  Normal bulk and tone. No joint deformity or effusion appreciated.  IMAGING CT CHEST W/O 06/09/16 (personally reviewed by me):  Right lower lobe and some pleural opacity significantly decreased in size when compared  with previous imaging. No new opacity appreciated. No pleural effusion or thickening. No pericardial effusion. No pathologic mediastinal adenopathy.  PET CT 03/07/16 (previously reviewed by me):  Right lower lobe subpleural patchy opacity with hypermetabolic activity in maximum SUV 6.0. No other hypermetabolic activity noted. No pathologic mediastinal or hilar adenopathy. No pleural effusion or thickening. No pericardial effusion.  CXR PA/LAT 08/16/15 (previously reviewed by me):  No focal opacity appreciated. No pleural effusion. Heart & mediastinum normal in size & contour.     Assessment & Plan:  71 y.o. female with right lower lobe hypermetabolic nodule/opacity that is progressively resolving. This likely represents an underlying resolving inflammatory/infectious process. I personally reviewed the patient's chest CT scan with her today as well as the abdominal images. There is cholelithiasis but the patient has no symptoms. I instructed the patient to notify her physician if she developed any new symptoms and also recommended consideration of an abdominal ultrasound dedicated to evaluation of her gallbladder and cholelithiasis. With no symptoms we did discuss repeat CT imaging as suggested by radiology in 6 months, but the probability of this representing a malignant process is low and the patient does not wish to proceed with further chest imaging. I feel this is reasonable. I instructed her to contact my office if she had any new breathing problems or changed her mind.  1. Hypermetabolic right lung nodule:  Resolving. Holding on further imaging. 2. Cholelithiasis: Recommended consideration of right upper quadrant ultrasound. 3. Follow-up: Return to clinic as needed.   Sonia Baller Ashok Cordia, M.D. [facility] Pulmonary [facility] Critical [facility] Pager:  [phone] After 3pm or if no response, call [phone] 9:08 AM 06/28/16

## 2016-06-28 ENCOUNTER — Encounter: Payer: Self-pay | Admitting: Pulmonary Disease

## 2016-06-28 ENCOUNTER — Ambulatory Visit (INDEPENDENT_AMBULATORY_CARE_PROVIDER_SITE_OTHER): Payer: Medicare Other | Admitting: Pulmonary Disease

## 2016-06-28 VITALS — BP 120/74 | HR 80 | Ht 65.0 in | Wt 147.0 lb

## 2016-06-28 DIAGNOSIS — R911 Solitary pulmonary nodule: Secondary | ICD-10-CM

## 2016-06-28 DIAGNOSIS — IMO0001 Reserved for inherently not codable concepts without codable children: Secondary | ICD-10-CM

## 2016-06-28 NOTE — Patient Instructions (Signed)
   Call me if you have any new breathing problems or cough.  If you change your mind about the chest CT scan then let me know.  Talk to your doctor about doing an ultrasound of your gallbladder to look at your stones.  I will see you back as needed. Safe travels.

## 2017-04-30 IMAGING — PT NM PET TUM IMG INITIAL (PI) SKULL BASE T - THIGH
8 series · 25 of 25 positions shown · non-contrast
Comparison: 02/21/2016 CT scan

ADDENDUM:
There was a transcription error in the impression of the original
report. Hypermetabolic irregular nodularity is in the right lower
lobe of the lung, not the right lobe of the liver. Impression item
#1 should read The irregular nodularity posteriorly in the right
lower lobe has a maximum SUV of 6.0, suspicious for malignancy.
Tissue diagnosis recommended. No adenopathy identified.

Findings were discussed with Dr. Kalkerup via telephone at the time
of this addendum.
CLINICAL DATA: Initial treatment strategy for lung nodules.
EXAM:
NUCLEAR MEDICINE PET SKULL BASE TO THIGH
TECHNIQUE: 7.4 mCi F-18 FDG was injected intravenously. Full-ring PET imaging
was performed from the skull base to thigh after the radiotracer. CT
data was obtained and used for attenuation correction and anatomic
localization.
FASTING BLOOD GLUCOSE:  Value: 112 mg/dl

[Series 3: pet sk_thigh ac · axial · 5.0mm · 4.07mm/px · z∈[-912,-68]mm · 5 of 212 slices shown]
[im 1/212]
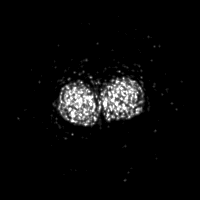
[im 53/212]
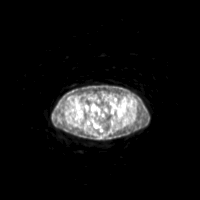
[im 106/212]
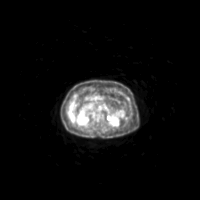
[im 159/212]
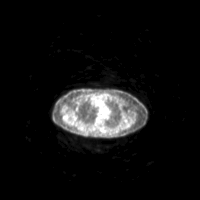
[im 212/212]
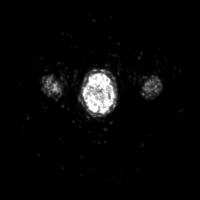

[Series 4: ct sk_thigh 5.0 b31f · axial · 5.0mm · 0.93mm/px · z∈[-912,-68]mm · 5 of 212 slices shown]
[im 1/212]
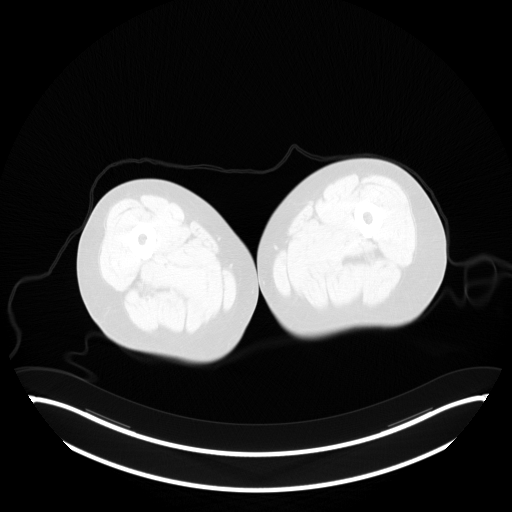
[im 53/212]
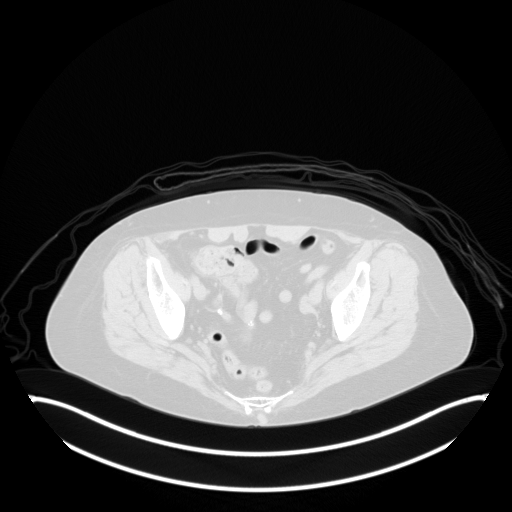
[im 106/212]
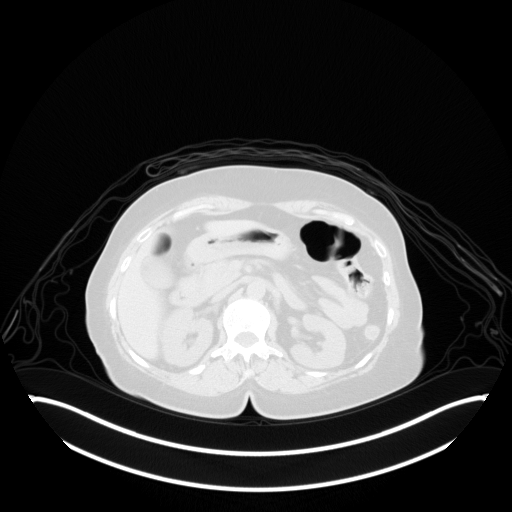
[im 159/212]
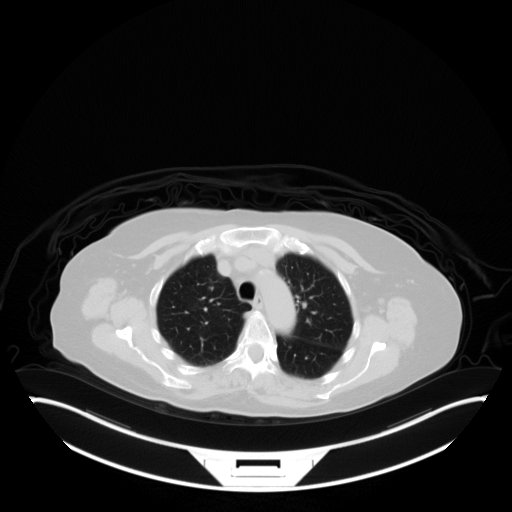
[im 212/212  brain]
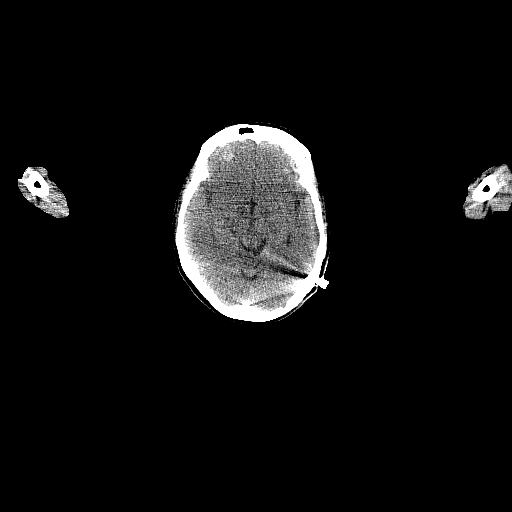

[Series 7: pet sk_thigh nac · axial · 5.0mm · 4.07mm/px · z∈[-912,-68]mm · 5 of 212 slices shown]
[im 1/212]
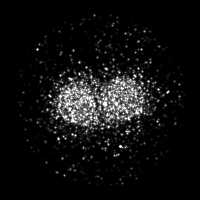
[im 53/212]
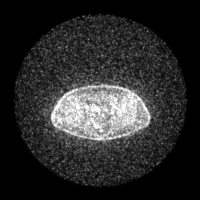
[im 106/212]
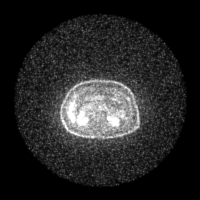
[im 159/212]
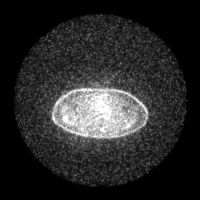
[im 212/212]
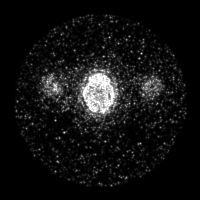

[Series 8: ct sk_thigh 5.0 b70f lung_bone · axial · 5.0mm · 0.56mm/px · 1 of 56 slices shown]
[im 1/56  bone]
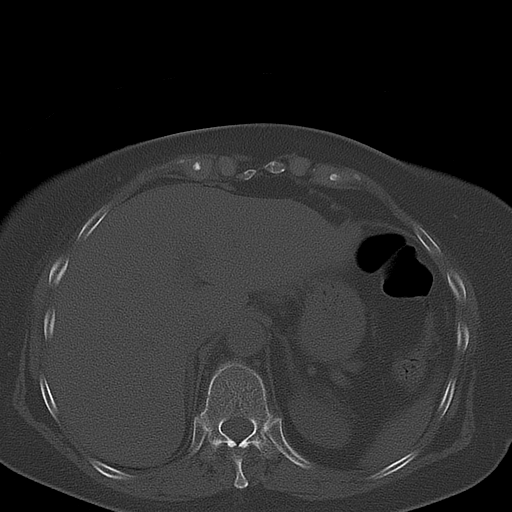

[Series 604: range-ct sk_thigh 5.0 (id)<alpha range(1)> · 2 of 66 slices shown]
[im 1/66]
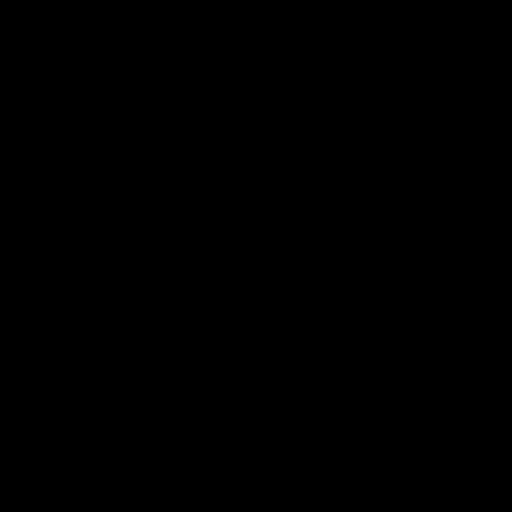
[im 66/66]
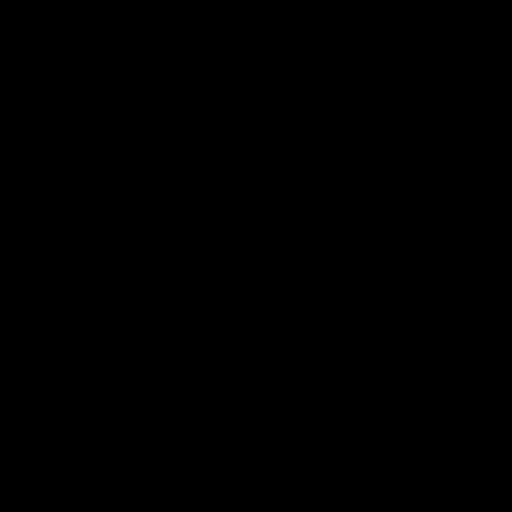

[Series 605: mip collection · coronal · 1.75mm/px · 1 of 32 slices shown]
[im 1/32]
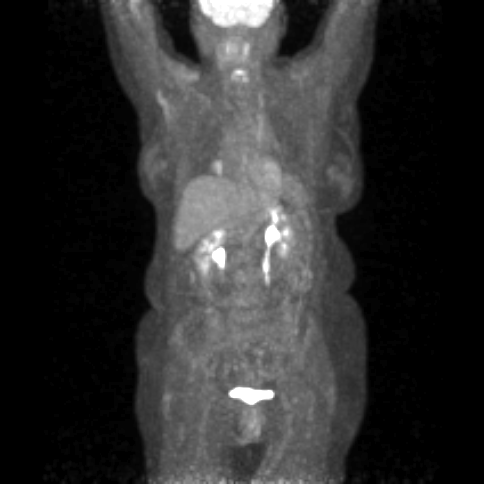

[Series 606: range-ct sk_thigh 5.0 (id)<alpha range> · 5 of 199 slices shown]
[im 1/199]
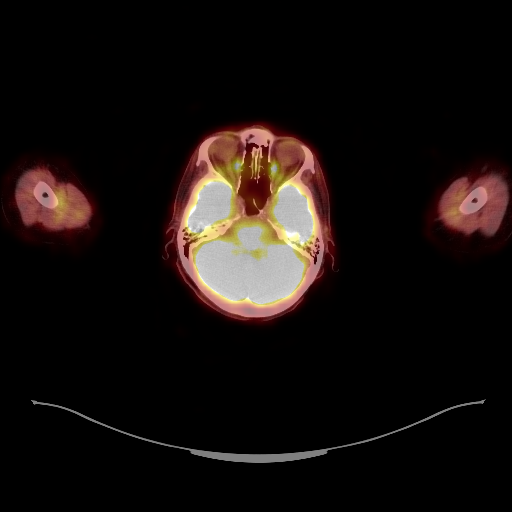
[im 50/199]
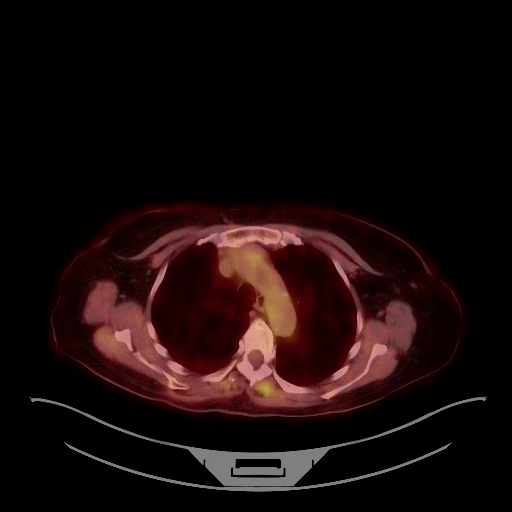
[im 100/199]
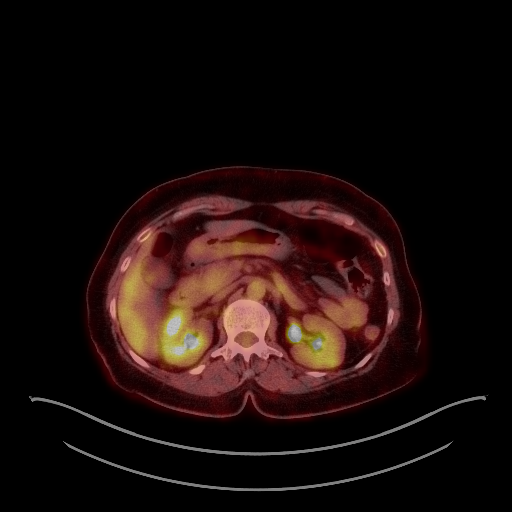
[im 149/199]
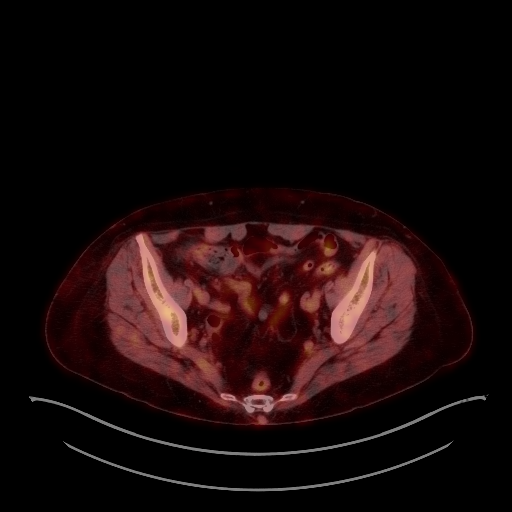
[im 199/199]
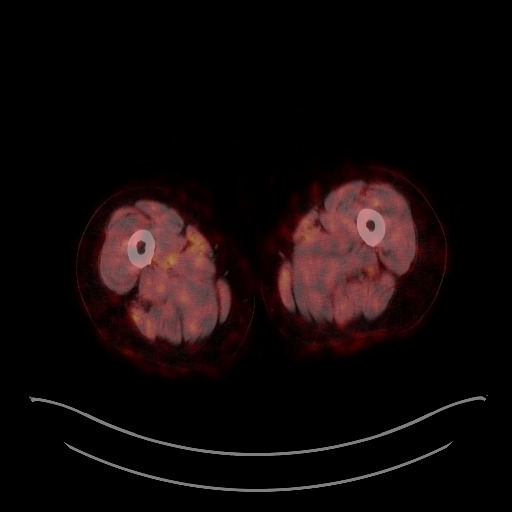

[Series 1034: results mm oncology reading · 5.0mm · 0.70mm/px · 1 of 1 slices shown]
[im 1/1]
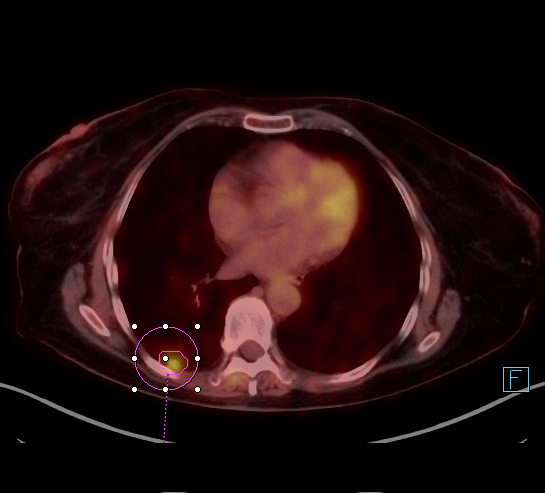

[25 of 25 positions shown; findings below may reference images not displayed]

FINDINGS: NECK

No hypermetabolic lymph nodes in the neck. Left lateral occipital
craniectomy.

CHEST

Irregular right posterior 2.0 by 1.1 cm opacity in the lower lobe
has nodular components with a similar configuration to the CT scan
from 02/21/2016. This lesion is hypermetabolic with a maximum SUV of
6.0.

Coronary, aortic arch, and branch vessel atherosclerotic vascular
disease. Clips in the lateral portion of the right breast. No
pathologic adenopathy.

ABDOMEN/PELVIS

No abnormal hypermetabolic activity within the liver, pancreas,
adrenal glands, or spleen. No hypermetabolic lymph nodes in the
abdomen or pelvis.

Small calcification along the right hepatic lobe capsule
posteriorly, image 86/4. Gallstone in the gallbladder measures
cm in diameter on image 100/4.

SKELETON

No focal hypermetabolic activity to suggest skeletal metastasis.
IMPRESSION: 1. The irregular nodularity posteriorly in the right hepatic lobe
has a maximum SUV of 6.0, suspicious for malignancy. Tissue
diagnosis recommended. No adenopathy identified.
2. Other imaging findings of potential clinical significance:
Coronary, aortic arch, and branch vessel atherosclerotic vascular
disease. Cholelithiasis.

## 2017-05-16 ENCOUNTER — Ambulatory Visit: Payer: Medicare Other | Admitting: Gastroenterology

## 2017-05-16 ENCOUNTER — Encounter: Payer: Self-pay | Admitting: Gastroenterology

## 2017-05-16 VITALS — BP 126/84 | HR 76 | Ht 64.75 in | Wt 153.4 lb

## 2017-05-16 DIAGNOSIS — K5909 Other constipation: Secondary | ICD-10-CM

## 2017-05-16 DIAGNOSIS — K648 Other hemorrhoids: Secondary | ICD-10-CM | POA: Diagnosis not present

## 2017-05-16 DIAGNOSIS — K625 Hemorrhage of anus and rectum: Secondary | ICD-10-CM

## 2017-05-16 NOTE — Progress Notes (Signed)
[facility] Consult Note:  History: Kristina Sloan 05/16/2017  Referring physician: Sharilyn Sites, MD  Reason for consult/chief complaint: Constipation (severe); Hemorrhoids (last flare 2-3 months ago, pt states due to constipation); and Rectal Bleeding (last flare 2-3 months ago)   Subjective  HPI:  This is a very pleasant 72 year old woman referred by primary care for chronic constipation and rectal bleeding.  She reports lifelong constipation since childhood where she might go 5 or 6 days without a BM if she does not take something to help.  Just stool softeners do not seem to help, but if she adds MiraLAX, her stool becomes too loose.  Even twice a day fiber dose and will make her stool to loose.  Once daily fiber will cause a stool to be semi-formed, but then she may have to go several times over a couple of hours to feel evacuated.  If she has not had a BM for several days she will feel bloating, and with straining she will get some bleeding that she has attributed to hemorrhoids. She reports that her appetite is good, weight stable, denies nausea vomiting dysphagia or heartburn. Her last colonoscopy by Dr. Gala Romney in July 2015 was reviewed.  The prep was reported as "adequate", the colon was redundant and an apparent polyp turned out to be normal colon tissue.  She was concerned about how to manage this because she will see me taking a 3-week trip to [place]. ROS:  Review of Systems  Constitutional: Negative for appetite change and unexpected weight change.  HENT: Negative for mouth sores and voice change.   Eyes: Negative for pain and redness.  Respiratory: Negative for cough and shortness of breath.   Cardiovascular: Negative for chest pain and palpitations.  Genitourinary: Negative for dysuria and hematuria.  Musculoskeletal: Positive for arthralgias. Negative for myalgias.  Skin: Negative for pallor and rash.  Neurological: Negative for weakness and  headaches.  Hematological: Negative for adenopathy.     Past Medical History: Past Medical History:  Diagnosis Date  . Breast cancer ([place]) 2003  . Breast cyst   . Cancer ([place]) 2003   rt breast 2003 lumpectomy/rad tx  . Chest pain 05/23/2012  . Complication of anesthesia    DIFFICULT WAKING ONLY ONCE  . Hard of hearing   . Hypertension      Past Surgical History: Past Surgical History:  Procedure Laterality Date  . BREAST LUMPECTOMY Right 12/2001  . COCHLEAR IMPLANT Left 2018  . COLONOSCOPY N/A 07/29/2013   Procedure: COLONOSCOPY;  Surgeon: Daneil Dolin, MD;  Location[facility];  Service: Endoscopy;  Laterality: N/A;  2:00 PM-moved to 915 Pt notified of time change  . Left [place] Procedure  1994  . STRABISMUS SURGERY Right   . TUBAL LIGATION       Family History: Family History  Problem Relation Age of Onset  . COPD Mother   . Hypertension Mother   . Bladder Cancer Father   . Bladder Cancer Brother     Social History: Social History   Socioeconomic History  . Marital status: Single    Spouse name: Not on file  . Number of children: 0  . Years of education: Not on file  . Highest education level: Not on file  Occupational History  . Occupation: retired  Scientific laboratory technician  . Financial resource strain: Not on file  . Food insecurity:    Worry: Not on file    Inability: Not on file  .  Transportation needs:    Medical: Not on file    Non-medical: Not on file  Tobacco Use  . Smoking status: Former Smoker    Packs/day: 1.00    Years: 25.00    Pack years: 25.00    Types: Cigarettes    Start date: 01/09/1965    Last attempt to quit: 01/09/1990    Years since quitting: 27.3  . Smokeless tobacco: Never Used  Substance and Sexual Activity  . Alcohol use: Yes    Comment: q pm, white wine  . Drug use: No  . Sexual activity: Not on file  Lifestyle  . Physical activity:    Days per week: Not on file    Minutes per session: Not on file  . Stress: Not on file   Relationships  . Social connections:    Talks on phone: Not on file    Gets together: Not on file    Attends religious service: Not on file    Active member of club or organization: Not on file    Attends meetings of clubs or organizations: Not on file    Relationship status: Not on file  Other Topics Concern  . Not on file  Social History Narrative   [employer] [facility] (03/15/16):   Originally from [place]. She has lived in [place] since 1951. She grew up with her mother. She has traveled to [place] (in June), [place], [place], [place], [place], [place], & multiple [place] states. 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 No bird or mold exposure. No recent hot tub exposure. Enjoys photography.     Allergies: No Known Allergies  Outpatient Meds: Current Outpatient Medications  Medication Sig Dispense Refill  . ALPRAZolam (XANAX) 1 MG tablet Take 0.5 mg by mouth 3 (three) times daily as needed for anxiety.    Marland Kitchen aspirin 81 MG tablet Take 81 mg by mouth daily.    . Biotin 5000 MCG TABS Take 1 tablet by mouth daily.    . Calcium Carbonate-Vitamin D (CALCIUM 600 + D PO) Take 1 tablet by mouth daily.    . Calcium Polycarbophil (FIBER-LAX PO) Take 1 Dose by mouth as needed.    . Cholecalciferol (VITAMIN D3) 2000 units capsule Take 2,000 Units by mouth daily.    . Coenzyme Q10 (CO Q 10) 100 MG CAPS Take 100 mg by mouth daily.     . Cyanocobalamin (VITAMIN B-12) 5000 MCG TBDP Take 1 tablet by mouth daily.    Marland Kitchen diltiazem (CARDIZEM LA) 240 MG 24 hr tablet Take 1 tablet (240 mg total) by mouth daily. 30 tablet 1  . docusate sodium (COLACE) 100 MG capsule Take 100 mg by mouth 2 (two) times daily.    . fish oil-omega-3 fatty acids 1000 MG capsule Take 2,000 mg by mouth daily.     . Magnesium 250 MG TABS Take 1 tablet by mouth daily.    . meloxicam (MOBIC) 7.5 MG tablet Take 7.5 mg by mouth daily.     No current facility-administered medications for this visit.        ___________________________________________________________________ Objective   Exam:  BP 126/84 (BP Location: Left Arm, Patient Position: Sitting, Cuff Size: Normal)   Pulse 76   Ht 5' 4.75" (1.645 m) Comment: height measured without shoes  Wt 153 lb 6 oz (69.6 kg)   BMI 25.72 kg/m    General: this is a(n) well-appearing woman  Eyes: sclera anicteric, no redness  ENT: oral mucosa moist without  lesions, no cervical or supraclavicular lymphadenopathy, good dentition  CV: RRR without murmur, S1/S2, no JVD, no peripheral edema.  Mild varicose veins lower legs  Resp: clear to auscultation bilaterally, normal RR and effort noted  GI: soft, no tenderness, with active bowel sounds. No guarding or palpable organomegaly noted.  Skin; warm and dry, no rash or jaundice noted  Neuro: awake, alert and oriented x 3. Normal gross motor function and fluent speech Rectal: Normal external, normal resting and voluntary sphincter tone.  No anal fissure or tenderness.  No palpable internal lesions.  Normal puborectalis contraction, normal abdominal wall contraction pelvic descent and visible perineal descent with Valsalva.   Anoscopy: Small internal hemorrhoids  Labs:  No PCP data  Assessment: Encounter Diagnoses  Name Primary?  . Chronic constipation Yes  . Rectal bleeding   . Internal hemorrhoids     Lifelong constipation that sounds functional, perhaps exacerbated by redundant colon anatomy.  Exam does not seem consistent with pelvic floor dysfunction/dyssynergia.  She also seems to be sensitive osmotic laxatives or even fiber. The bleeding is hemorrhoidal and now reportedly very infrequent since she has been moving her bowels better on a fiber regimen.  I do not think prescription medicines for constipation will likely agree with her, as they will probably cause loose stool.  I think her current plan is probably the best she can achieve given her response to treatment so far.   She will probably just need to work this into her schedule in a way that is most convenient.  Using the therapy in the evening might be better in the morning so even if she has to go to the bathroom multiple times, it is less likely to interfere with her other activities.  She does not appear to need a colonoscopy at this point.  I also do not think she should pursue hemorrhoidal banding since she reports that the swelling and bleeding of the hemorrhoids is infrequent if she is moving her bowels regularly.  Plan:  This plan was discussed with her in detail.  She will also take some OTC MiraLAX packets with her on her trip in case she needs them some evening for relief of constipation should she develop worsening symptoms related to travel.  She will otherwise see me as needed, and should have her next routine colonoscopy around July 2025.  Thank you for the courtesy of this consult.  Please call me with any questions or concerns.  Nelida Meuse III  CC: Sharilyn Sites, MD

## 2017-05-16 NOTE — Patient Instructions (Signed)
If you are age 70 or older, your body mass index should be between 23-30. Your Body mass index is 25.72 kg/m. If this is out of the aforementioned range listed, please consider follow up with your Primary Care Provider.  If you are age 22 or younger, your body mass index should be between 19-25. Your Body mass index is 25.72 kg/m. If this is out of the aformentioned range listed, please consider follow up with your Primary Care Provider.   Follow up as needed.  Thank you for choosing [facility]  Dr Wilfrid Lund III

## 2017-10-15 ENCOUNTER — Observation Stay (HOSPITAL_BASED_OUTPATIENT_CLINIC_OR_DEPARTMENT_OTHER): Payer: Medicare Other

## 2017-10-15 ENCOUNTER — Observation Stay (HOSPITAL_COMMUNITY)
Admission: EM | Admit: 2017-10-15 | Discharge: 2017-10-16 | Disposition: A | Discharge status: Home / Self Care | Payer: Medicare Other | Attending: Internal Medicine | Admitting: Internal Medicine

## 2017-10-15 ENCOUNTER — Other Ambulatory Visit (HOSPITAL_COMMUNITY): Payer: Self-pay | Admitting: Family Medicine

## 2017-10-15 ENCOUNTER — Other Ambulatory Visit: Payer: Self-pay

## 2017-10-15 ENCOUNTER — Encounter (HOSPITAL_COMMUNITY): Payer: Self-pay | Admitting: *Deleted

## 2017-10-15 ENCOUNTER — Emergency Department (HOSPITAL_COMMUNITY): Payer: Medicare Other

## 2017-10-15 ENCOUNTER — Ambulatory Visit (HOSPITAL_COMMUNITY)
Admission: RE | Admit: 2017-10-15 | Discharge: 2017-10-15 | Disposition: A | Discharge status: Home / Self Care | Payer: Medicare Other | Source: Ambulatory Visit | Attending: Family Medicine | Admitting: Family Medicine

## 2017-10-15 DIAGNOSIS — M539 Dorsopathy, unspecified: Secondary | ICD-10-CM

## 2017-10-15 DIAGNOSIS — M546 Pain in thoracic spine: Secondary | ICD-10-CM

## 2017-10-15 DIAGNOSIS — E785 Hyperlipidemia, unspecified: Secondary | ICD-10-CM | POA: Insufficient documentation

## 2017-10-15 DIAGNOSIS — Z87891 Personal history of nicotine dependence: Secondary | ICD-10-CM | POA: Diagnosis not present

## 2017-10-15 DIAGNOSIS — Z853 Personal history of malignant neoplasm of breast: Secondary | ICD-10-CM

## 2017-10-15 DIAGNOSIS — M4124 Other idiopathic scoliosis, thoracic region: Secondary | ICD-10-CM | POA: Diagnosis not present

## 2017-10-15 DIAGNOSIS — I10 Essential (primary) hypertension: Secondary | ICD-10-CM | POA: Insufficient documentation

## 2017-10-15 DIAGNOSIS — Z7982 Long term (current) use of aspirin: Secondary | ICD-10-CM | POA: Insufficient documentation

## 2017-10-15 DIAGNOSIS — M419 Scoliosis, unspecified: Secondary | ICD-10-CM

## 2017-10-15 DIAGNOSIS — R079 Chest pain, unspecified: Secondary | ICD-10-CM | POA: Diagnosis present

## 2017-10-15 DIAGNOSIS — I361 Nonrheumatic tricuspid (valve) insufficiency: Secondary | ICD-10-CM

## 2017-10-15 DIAGNOSIS — M4182 Other forms of scoliosis, cervical region: Secondary | ICD-10-CM

## 2017-10-15 DIAGNOSIS — Z79899 Other long term (current) drug therapy: Secondary | ICD-10-CM | POA: Diagnosis not present

## 2017-10-15 DIAGNOSIS — M4184 Other forms of scoliosis, thoracic region: Secondary | ICD-10-CM

## 2017-10-15 DIAGNOSIS — F419 Anxiety disorder, unspecified: Secondary | ICD-10-CM | POA: Diagnosis not present

## 2017-10-15 HISTORY — DX: Scoliosis, unspecified: M41.9

## 2017-10-15 LAB — BASIC METABOLIC PANEL
ANION GAP: 10 (ref 5–15)
BUN: 8 mg/dL (ref 8–23)
CALCIUM: 9.2 mg/dL (ref 8.9–10.3)
CO2: 24 mmol/L (ref 22–32)
Chloride: 104 mmol/L (ref 98–111)
Creatinine, Ser: 0.51 mg/dL (ref 0.44–1.00)
GFR calc Af Amer: >60 mL/min (ref >60)
GLUCOSE: 107 mg/dL — AB (ref 70–99)
POTASSIUM: 3.5 mmol/L (ref 3.5–5.1)
SODIUM: 138 mmol/L (ref 135–145)

## 2017-10-15 LAB — CBC
HEMATOCRIT: 41 % (ref 36.0–46.0)
Hemoglobin: 14.4 g/dL (ref 12.0–15.0)
MCH: 31 pg (ref 26.0–34.0)
MCHC: 35.1 g/dL (ref 30.0–36.0)
MCV: 88.4 fL (ref 78.0–100.0)
PLATELETS: 220 10*3/uL (ref 150–400)
RBC: 4.64 MIL/uL (ref 3.87–5.11)
RDW: 12.4 % (ref 11.5–15.5)
WBC: 6.7 10*3/uL (ref 4.0–10.5)

## 2017-10-15 LAB — TROPONIN I: Troponin I: <0.03 ng/mL (ref <0.03)

## 2017-10-15 LAB — ECHOCARDIOGRAM COMPLETE
Height: 66 in
Weight: 2372.8 oz

## 2017-10-15 LAB — TSH: TSH: 0.656 u[IU]/mL (ref 0.350–4.500)

## 2017-10-15 MED ORDER — ENOXAPARIN SODIUM 40 MG/0.4ML ~~LOC~~ SOLN
40.0000 mg | SUBCUTANEOUS | Status: DC
  Administered 2017-10-15: 40 mg via SUBCUTANEOUS
  Filled 2017-10-15: qty 0.4

## 2017-10-15 MED ORDER — ALPRAZOLAM 0.5 MG PO TABS
0.5000 mg | ORAL_TABLET | Freq: Three times a day (TID) | ORAL | Status: DC | PRN
  Administered 2017-10-15: 0.5 mg via ORAL
  Filled 2017-10-15: qty 1

## 2017-10-15 MED ORDER — VITAMIN B-12 1000 MCG PO TABS
500.0000 ug | ORAL_TABLET | Freq: Every day | ORAL | Status: DC
  Administered 2017-10-15: 500 ug via ORAL
  Filled 2017-10-15 (×2): qty 1

## 2017-10-15 MED ORDER — MAGNESIUM OXIDE 400 (241.3 MG) MG PO TABS
200.0000 mg | ORAL_TABLET | Freq: Every day | ORAL | Status: DC
  Administered 2017-10-15: 200 mg via ORAL
  Filled 2017-10-15 (×2): qty 1

## 2017-10-15 MED ORDER — ASPIRIN EC 81 MG PO TBEC
81.0000 mg | DELAYED_RELEASE_TABLET | Freq: Every day | ORAL | Status: DC
  Filled 2017-10-15: qty 1

## 2017-10-15 MED ORDER — HYDRALAZINE HCL 20 MG/ML IJ SOLN: 10.0000 mg | Freq: Three times a day (TID) | INTRAMUSCULAR | Status: DC | PRN

## 2017-10-15 MED ORDER — ASPIRIN 81 MG PO CHEW
324.0000 mg | CHEWABLE_TABLET | Freq: Once | ORAL | Status: AC
Start: 2017-10-15 — End: 2017-10-15
  Administered 2017-10-15: 324 mg via ORAL
  Filled 2017-10-15: qty 4

## 2017-10-15 MED ORDER — ACETAMINOPHEN 325 MG PO TABS: 650.0000 mg | ORAL_TABLET | ORAL | Status: DC | PRN

## 2017-10-15 MED ORDER — VITAMIN D 1000 UNITS PO TABS
2000.0000 [IU] | ORAL_TABLET | Freq: Every day | ORAL | Status: DC
  Administered 2017-10-15: 2000 [IU] via ORAL
  Filled 2017-10-15 (×2): qty 2

## 2017-10-15 MED ORDER — ONDANSETRON HCL 4 MG/2ML IJ SOLN: 4.0000 mg | Freq: Four times a day (QID) | INTRAMUSCULAR | Status: DC | PRN

## 2017-10-15 MED ORDER — GI COCKTAIL ~~LOC~~: 30.0000 mL | Freq: Four times a day (QID) | ORAL | Status: DC | PRN

## 2017-10-15 MED ORDER — MORPHINE SULFATE (PF) 2 MG/ML IV SOLN: 2.0000 mg | INTRAVENOUS | Status: DC | PRN

## 2017-10-15 MED ORDER — SODIUM CHLORIDE 0.9 % IV SOLN
INTRAVENOUS | Status: AC
  Administered 2017-10-15: 18:00:00 via INTRAVENOUS

## 2017-10-15 MED ORDER — DILTIAZEM HCL ER COATED BEADS 240 MG PO TB24
240.0000 mg | ORAL_TABLET | Freq: Every day | ORAL | Status: DC
  Administered 2017-10-15: 240 mg via ORAL
  Filled 2017-10-15 (×5): qty 1

## 2017-10-15 MED ORDER — DOCUSATE SODIUM 100 MG PO CAPS
100.0000 mg | ORAL_CAPSULE | Freq: Two times a day (BID) | ORAL | Status: DC
  Administered 2017-10-15 (×2): 100 mg via ORAL
  Filled 2017-10-15 (×3): qty 1

## 2017-10-15 MED ORDER — CARVEDILOL 12.5 MG PO TABS
12.5000 mg | ORAL_TABLET | Freq: Two times a day (BID) | ORAL | Status: DC
  Administered 2017-10-15: 12.5 mg via ORAL
  Filled 2017-10-15 (×2): qty 1

## 2017-10-15 MED ORDER — MELOXICAM 7.5 MG PO TABS
7.5000 mg | ORAL_TABLET | Freq: Every day | ORAL | Status: DC
  Filled 2017-10-15: qty 1

## 2017-10-15 MED ORDER — OMEGA-3-ACID ETHYL ESTERS 1 G PO CAPS
2000.0000 mg | ORAL_CAPSULE | Freq: Every day | ORAL | Status: DC
  Administered 2017-10-15: 2000 mg via ORAL
  Filled 2017-10-15 (×2): qty 2

## 2017-10-15 NOTE — H&P (Signed)
History and Physical    GWENYTH DINGEE JME:[id number] DOB: 06-Jul-1945 DOA: 10/15/2017  Referring MD/NP/PA: Dr. Winfred Leeds PCP: Sharilyn Sites, MD  Patient coming from: Home  Chief Complaint: Chest pain  HPI: Kristina Sloan is a 72 y.o. female with a past medical history significant for breast cancer (status post lumpectomy and radiation therapy), hypertension, dyslipidemia and degenerative disc disease/scoliosis; who presented to the hospital secondary to chest pain.  Patient describes pain in the middle of her chest, substernal area, radiated to her back and ascending to her jaw, pressure/tightness feeling, she reported as an elephant was standing on her chest; 8 out of 10 in intensity, lasted approximately 20-30 minutes, resolve completely after receiving full dose aspirin in the ED.  There was no shortness of breath, palpitations, diaphoresis, nausea or vomiting associated with the pain. Patient also denies abdominal pain, fever, chills, cough, dysuria, hematuria, focal weakness, headache, blurred vision or any other complaints.  In the ED after aspirin was given patient was no complaining of any other discomfort and was only found with elevated blood pressure.  EKG without acute ischemic changes, troponin negative x1.  TRH has been called to place patient in observation for chest pain rule out.  Past Medical/Surgical History: Past Medical History:  Diagnosis Date  . Breast cancer ([place]) 2003  . Breast cyst   . Cancer ([place]) 2003   rt breast 2003 lumpectomy/rad tx  . Chest pain 05/23/2012  . Complication of anesthesia    DIFFICULT WAKING ONLY ONCE  . Hard of hearing   . Hypertension   . Scoliosis     Past Surgical History:  Procedure Laterality Date  . BREAST LUMPECTOMY Right 12/2001  . COCHLEAR IMPLANT Left 2018  . COLONOSCOPY N/A 07/29/2013   Procedure: COLONOSCOPY;  Surgeon: Daneil Dolin, MD;  Location: [facility];  Service: Endoscopy;  Laterality: N/A;  2:00 PM-moved to 915  Pt notified of time change  . Left [facility] Procedure  1994  . STRABISMUS SURGERY Right   . TUBAL LIGATION      Social History:  reports that she quit smoking about 27 years ago. Her smoking use included cigarettes. She started smoking about 52 years ago. She has a 25.00 pack-year smoking history. She has never used smokeless tobacco. She reports that she drinks alcohol. She reports that she does not use drugs.  Allergies: No Known Allergies  Family History:  Family History  Problem Relation Age of Onset  . COPD Mother   . Hypertension Mother   . Bladder Cancer Father   . Bladder Cancer Brother     Prior to Admission medications   Medication Sig Start Date End Date Taking? Authorizing Provider  ALPRAZolam Duanne Moron) 1 MG tablet Take 0.5 mg by mouth 3 (three) times daily as needed for anxiety.    [provider]  aspirin 81 MG tablet Take 81 mg by mouth daily.    [provider]  Biotin 5000 MCG TABS Take 1 tablet by mouth daily.    [provider]  Calcium Carbonate-Vitamin D (CALCIUM 600 + D PO) Take 1 tablet by mouth daily.    [provider]  Calcium Polycarbophil (FIBER-LAX PO) Take 1 Dose by mouth as needed.    [provider]  Cholecalciferol (VITAMIN D3) 2000 units capsule Take 2,000 Units by mouth daily.    [provider]  Coenzyme Q10 (CO Q 10) 100 MG CAPS Take 100 mg by mouth daily.     [provider]  Cyanocobalamin (VITAMIN B-12) 5000 MCG TBDP Take 1 tablet by mouth daily.    [provider]  diltiazem (CARDIZEM LA) 240 MG 24 hr tablet Take 1 tablet (240 mg total) by mouth daily. 05/25/12   Dianne Dun, NP  docusate sodium (COLACE) 100 MG capsule Take 100 mg by mouth 2 (two) times daily.    [provider]  fish oil-omega-3 fatty acids 1000 MG capsule Take 2,000 mg by mouth daily.     [provider]  Magnesium 250 MG TABS Take 1 tablet by mouth daily.    [provider]  meloxicam (MOBIC) 7.5 MG tablet Take 7.5 mg by mouth daily.    [provider]    Review of Systems:  Negative except as otherwise mentioned in HPI.  Physical Exam: Vitals:   10/15/17 1223 10/15/17 1230 10/15/17 1245  BP:  (!) 178/97   Pulse:  88   Resp:  15   Temp:   98.4 F (36.9 C)  TempSrc:   Oral  SpO2:  96%   Weight: 69.1 kg    Height: 5\' 6"  (1.676 m)      Constitutional: NAD, calm, comfortable; currently denying any chest pain, shortness of breath, nausea, vomiting or abdominal pain.  Patient expressed having some lower back discomfort (which is not new). Eyes: PERRL, lids and conjunctivae normal, no icterus, no nystagmus. ENMT: Mucous membranes are moist. Posterior pharynx clear of any exudate or lesions. Normal dentition.  Neck: normal, supple, no masses, no thyromegaly, no JVD Respiratory: clear to auscultation bilaterally, no wheezing, no crackles. Normal respiratory effort. No accessory muscle use.  Cardiovascular: Regular rate and rhythm, no murmurs / rubs / gallops. No extremity edema. 2+ pedal pulses. No carotid bruits.  Abdomen: no tenderness, no masses palpated. No hepatosplenomegaly. Bowel sounds positive.  Musculoskeletal: no clubbing / cyanosis. No joint deformity upper and lower extremities. Good ROM, no contractures. Normal muscle tone.  Some tenderness to palpation in the lower back was appreciated on both sides of her spine. Skin: no rashes, lesions, ulcers. No induration or open wounds on exam. Neurologic: CN 2-12 grossly intact. Sensation intact, DTR normal. Strength 5/5 in all 4.  Psychiatric: Normal judgment and insight. Alert and oriented x 3. Normal mood.    Labs on Admission: I have personally reviewed the following labs and imaging studies  CBC: Recent Labs  Lab 10/15/17 1242  WBC 6.7  HGB 14.4  HCT 41.0  MCV 88.4  PLT 151   Basic Metabolic Panel: Recent Labs  Lab 10/15/17 1242  NA 138  K 3.5  CL 104  CO2 24   GLUCOSE 107*  BUN 8  CREATININE 0.51  CALCIUM 9.2   GFR: Estimated Creatinine Clearance: 62.1 mL/min (by C-G formula based on SCr of 0.51 mg/dL).  Cardiac Enzymes: Recent Labs  Lab 10/15/17 1242  TROPONINI <0.03   Radiological Exams on Admission: Dg Chest Port 1 View  Result Date: 10/15/2017 CLINICAL DATA:  Crushing chest pain radiating to the back and jaws that began today following scoliosis radiographs. History of breast cancer. EXAM: PORTABLE CHEST 1 VIEW COMPARISON:  Chest CT dated 06/09/2016 and PET-CT dated 03/07/2016. FINDINGS: Normal sized heart. Tortuous aorta. Stable mildly elevated left hemidiaphragm and right axillary surgical clips. Stable minimal scarring at the left lateral lung base. Otherwise, the visualized lungs are clear. Mild scoliosis. IMPRESSION: No acute abnormality. Electronically Signed   By: Claudie Revering M.D.   On: 10/15/2017 13:14   Dg  Scoliosis Eval Complete Spine 1 View  Result Date: 10/15/2017 CLINICAL DATA:  Back pain. Known scoliosis. EXAM: DG SCOLIOSIS EVAL COMPLETE SPINE 1V COMPARISON:  Chest CT dated 06/09/2016. FINDINGS: 29 degrees of levoconvex thoracic scoliosis with its apex at the T5-6 level. 13 degrees of dextroconvex cervical scoliosis with its apex at the C4-5 level. No congenital vertebral anomalies seen. IMPRESSION: Mild to moderate scoliosis, as described above. Electronically Signed   By: Claudie Revering M.D.   On: 10/15/2017 13:20    EKG: Independently reviewed.  No acute ischemic changes appreciated on exam; normal sinus rhythm, normal axis.  Assessment/Plan 1-chest pain: Risk factors hypertension, dyslipidemia age and prior history of tobacco abuse.  Heart score 3-4. -Place in observation telemetry bed -Cycle troponins and EKG -Check 2D echo -Started on aspirin and carvedilol -PRN morphine for pain -Based on evolution and troponin will discuss with cardiology service for need of inpatient stratification testings. -Will check TSH,  A1c and lipid panel. -PRN GI cocktail ordered.  2-history of breast cancer -Appears to be in remission -Continue outpatient follow-up and surveillance with oncology service.  3-hypertension: -Resume home dose of Cardizem -Started on carvedilol -Follow vital signs and adjust antihypertensive regimen as needed -PRN hydralazine has been ordered. -Patient complaining of lower back pain which could also aggravate her blood pressure.  4-scoliosis/musculoskeletal pain -No significant abnormalities appreciated on x-ray today -She received a steroidal injection and injected pain medication at her PCP office early this morning. -Continue to use Mobic -will add PRN robaxin to assist with muscular relaxation   5-anxiety -overall mood is stable -will continue PRN xanax.  DVT prophylaxis: lovenox Code Status: Full code Family Communication: Brother at bedside Disposition Plan: Anticipate discharge back home as early as tomorrow (10/80/19) if there is negative work-up for ACS. Consults called: None Admission status: Observation, telemetry, LOS less than 2 midnights.   Time Spent: 65 minutes  Barton Dubois MD [facility] Pager [phone]  If 7PM-7AM, please contact night-coverage [email or web address] Password [email or web address]  10/15/2017, 2:22 PM

## 2017-10-15 NOTE — ED Notes (Addendum)
Radiology called to report that pt had a scoliosis eval complete done this morning not CXR. Therefore CXR order was entered per triage standing orders.

## 2017-10-15 NOTE — ED Notes (Signed)
X-ray at bedside

## 2017-10-15 NOTE — ED Triage Notes (Signed)
Pt reports she had a CXR done this morning here at [facility] about 30 minutes ago.

## 2017-10-15 NOTE — ED Triage Notes (Signed)
Pt c/o mid chest pain that radiates to bilateral jaws and straight through to the back along with SOB and lightheadedness that started suddenly. Pt denies nausea/vomiting. Pt reports the pain feels like pressure/heaviness.

## 2017-10-15 NOTE — ED Notes (Signed)
Report given to floor

## 2017-10-15 NOTE — Progress Notes (Signed)
*  PRELIMINARY RESULTS* Echocardiogram 2D Echocardiogram has been performed.  Kristina Sloan 10/15/2017, 4:29 PM

## 2017-10-15 NOTE — ED Provider Notes (Addendum)
[facility] EMERGENCY DEPARTMENT Provider Note   CSN: [id number] Arrival date & time: 10/15/17  1217     History   Chief Complaint Chief Complaint  Patient presents with  . Chest Pain    HPI Kristina Sloan is a 72 y.o. female. Patient developed anterior chest pain described as pressure "like a bulldozer was sitting on my chest" onset 30 minutes prior to coming here, while at rest lasted approximately 20 minutes radiating to back and to both jaws.  Associated symptoms include lightheadedness and shortness of breath no nausea or sweatiness symptoms resolved after 20 minutes.  She is presently asymptomatic without treatment.  She had a similar episode in the past, however did not get evaluated.  She denies any nausea HPI  Past Medical History:  Diagnosis Date  . Breast cancer ([place]) 2003  . Breast cyst   . Cancer ([place]) 2003   rt breast 2003 lumpectomy/rad tx  . Chest pain 05/23/2012  . Complication of anesthesia    DIFFICULT WAKING ONLY ONCE  . Hard of hearing   . Hypertension   . Scoliosis     Patient Active Problem List   Diagnosis Date Noted  . Lung nodule < 6cm on CT 03/15/2016  . Generalized anxiety disorder 03/15/2016  . Chest pain with moderate risk for cardiac etiology 05/24/2012  . HTN (hypertension) 05/24/2012  . Hyperlipidemia 05/24/2012    Past Surgical History:  Procedure Laterality Date  . BREAST LUMPECTOMY Right 12/2001  . COCHLEAR IMPLANT Left 2018  . COLONOSCOPY N/A 07/29/2013   Procedure: COLONOSCOPY;  Surgeon: Daneil Dolin, MD;  Location: [facility] ENDO SUITE;  Service: Endoscopy;  Laterality: N/A;  2:00 PM-moved to 915 Pt notified of time change  . Left [facility] Procedure  1994  . STRABISMUS SURGERY Right   . TUBAL LIGATION       OB History   None      Home Medications    Prior to Admission medications   Medication Sig Start Date End Date Taking? Authorizing Provider  ALPRAZolam Duanne Moron) 1 MG tablet Take 0.5 mg by mouth 3 (three) times daily as  needed for anxiety.    [provider]  aspirin 81 MG tablet Take 81 mg by mouth daily.    [provider]  Biotin 5000 MCG TABS Take 1 tablet by mouth daily.    [provider]  Calcium Carbonate-Vitamin D (CALCIUM 600 + D PO) Take 1 tablet by mouth daily.    [provider]  Calcium Polycarbophil (FIBER-LAX PO) Take 1 Dose by mouth as needed.    [provider]  Cholecalciferol (VITAMIN D3) 2000 units capsule Take 2,000 Units by mouth daily.    [provider]  Coenzyme Q10 (CO Q 10) 100 MG CAPS Take 100 mg by mouth daily.     [provider]  Cyanocobalamin (VITAMIN B-12) 5000 MCG TBDP Take 1 tablet by mouth daily.    [provider]  diltiazem (CARDIZEM LA) 240 MG 24 hr tablet Take 1 tablet (240 mg total) by mouth daily. 05/25/12   Dianne Dun, NP  docusate sodium (COLACE) 100 MG capsule Take 100 mg by mouth 2 (two) times daily.    [provider]  fish oil-omega-3 fatty acids 1000 MG capsule Take 2,000 mg by mouth daily.     [provider]  Magnesium 250 MG TABS Take 1 tablet by mouth daily.    [provider]  meloxicam (MOBIC) 7.5 MG tablet Take  7.5 mg by mouth daily.    [provider]    Family History Family History  Problem Relation Age of Onset  . COPD Mother   . Hypertension Mother   . Bladder Cancer Father   . Bladder Cancer Brother     Social History Social History   Tobacco Use  . Smoking status: Former Smoker    Packs/day: 1.00    Years: 25.00    Pack years: 25.00    Types: Cigarettes    Start date: 01/09/1965    Last attempt to quit: 01/09/1990    Years since quitting: 27.7  . Smokeless tobacco: Never Used  Substance Use Topics  . Alcohol use: Yes    Comment: q pm, white wine  . Drug use: No     Allergies   Patient has no known allergies.   Review of Systems Review of Systems  Constitutional: Negative.   HENT: Negative.     Respiratory: Positive for shortness of breath.   Cardiovascular: Positive for chest pain.  Gastrointestinal: Negative.   Musculoskeletal: Negative.   Skin: Negative.   Neurological: Positive for light-headedness.  Psychiatric/Behavioral: Negative.   All other systems reviewed and are negative.    Physical Exam Updated Vital Signs BP (!) 178/97   Pulse 85   Temp 98.4 F (36.9 C) (Oral)   Resp 15   Ht 5\' 6"  (1.676 m)   Wt 69.1 kg   SpO2 96%   BMI 24.60 kg/m   Physical Exam  Constitutional: She appears well-developed and well-nourished.  HENT:  Head: Normocephalic and atraumatic.  Eyes: Pupils are equal, round, and reactive to light. Conjunctivae are normal.  Neck: Neck supple. No tracheal deviation present. No thyromegaly present.  Cardiovascular: Normal rate and regular rhythm.  No murmur heard. Pulmonary/Chest: Effort normal and breath sounds normal.  Abdominal: Soft. Bowel sounds are normal. She exhibits no distension. There is no tenderness.  Musculoskeletal: Normal range of motion. She exhibits no edema or tenderness.  Neurological: She is alert. Coordination normal.  Skin: Skin is warm and dry. No rash noted.  Psychiatric: She has a normal mood and affect.  Nursing note and vitals reviewed.    ED Treatments / Results  Labs (all labs ordered are listed, but only abnormal results are displayed) Labs Reviewed  BASIC METABOLIC PANEL  CBC  TROPONIN I    EKG EKG Interpretation  Date/Time:  Monday October 15 2017 12:29:02 EDT Ventricular Rate:  83 PR Interval:    QRS Duration: 89 QT Interval:  370 QTC Calculation: 435 R Axis:   34 Text Interpretation:  Sinus rhythm Low voltage, precordial leads No significant change since last tracing Confirmed by Orlie Dakin [phone]) on 10/15/2017 12:38:40 PM   Radiology No results found. Chest x-ray viewed by me Procedures Procedures (including critical care time)  Medications Ordered in ED Medications   aspirin chewable tablet 324 mg (has no administration in time range)    Results for orders placed or performed during the hospital encounter of 54/00/86  Basic metabolic panel  Result Value Ref Range   Sodium 138 135 - 145 mmol/L   Potassium 3.5 3.5 - 5.1 mmol/L   Chloride 104 98 - 111 mmol/L   CO2 24 22 - 32 mmol/L   Glucose, Bld 107 (H) 70 - 99 mg/dL   BUN 8 8 - 23 mg/dL   Creatinine, Ser 0.51 0.44 - 1.00 mg/dL   Calcium 9.2 8.9 - 10.3 mg/dL   GFR calc non Af  Amer >60 >60 mL/min   GFR calc Af Amer >60 >60 mL/min   Anion gap 10 5 - 15  CBC  Result Value Ref Range   WBC 6.7 4.0 - 10.5 K/uL   RBC 4.64 3.87 - 5.11 MIL/uL   Hemoglobin 14.4 12.0 - 15.0 g/dL   HCT 41.0 36.0 - 46.0 %   MCV 88.4 78.0 - 100.0 fL   MCH 31.0 26.0 - 34.0 pg   MCHC 35.1 30.0 - 36.0 g/dL   RDW 12.4 11.5 - 15.5 %   Platelets 220 150 - 400 K/uL  Troponin I  Result Value Ref Range   Troponin I <0.03 <0.03 ng/mL   Dg Chest Port 1 View  Result Date: 10/15/2017 CLINICAL DATA:  Crushing chest pain radiating to the back and jaws that began today following scoliosis radiographs. History of breast cancer. EXAM: PORTABLE CHEST 1 VIEW COMPARISON:  Chest CT dated 06/09/2016 and PET-CT dated 03/07/2016. FINDINGS: Normal sized heart. Tortuous aorta. Stable mildly elevated left hemidiaphragm and right axillary surgical clips. Stable minimal scarring at the left lateral lung base. Otherwise, the visualized lungs are clear. Mild scoliosis. IMPRESSION: No acute abnormality. Electronically Signed   By: Claudie Revering M.D.   On: 10/15/2017 13:14   Dg Scoliosis Eval Complete Spine 1 View  Result Date: 10/15/2017 CLINICAL DATA:  Back pain. Known scoliosis. EXAM: DG SCOLIOSIS EVAL COMPLETE SPINE 1V COMPARISON:  Chest CT dated 06/09/2016. FINDINGS: 29 degrees of levoconvex thoracic scoliosis with its apex at the T5-6 level. 13 degrees of dextroconvex cervical scoliosis with its apex at the C4-5 level. No congenital vertebral  anomalies seen. IMPRESSION: Mild to moderate scoliosis, as described above. Electronically Signed   By: Claudie Revering M.D.   On: 10/15/2017 13:20   Initial Impression / Assessment and Plan / ED Course  I have reviewed the triage vital signs and the nursing notes.  Pertinent labs & imaging results that were available during my care of the patient were reviewed by me and considered in my medical decision making (see chart for details).     1:35 PM patient remains asymptomatic after treatment with aspirin concern for anginal type symptoms.  Heart score equals 4.  Dr.Madera consulted.  Will see patient and evaluate for overnight stay Final Clinical Impressions(s) / ED Diagnoses  Diagnosis chest pain at rest Final diagnoses:  None    ED Discharge Orders    None       Orlie Dakin, MD 10/15/17 [place], MD 10/15/17 1416

## 2017-10-16 DIAGNOSIS — M419 Scoliosis, unspecified: Secondary | ICD-10-CM | POA: Diagnosis not present

## 2017-10-16 DIAGNOSIS — F419 Anxiety disorder, unspecified: Secondary | ICD-10-CM | POA: Diagnosis not present

## 2017-10-16 DIAGNOSIS — R079 Chest pain, unspecified: Secondary | ICD-10-CM | POA: Diagnosis not present

## 2017-10-16 LAB — BASIC METABOLIC PANEL
ANION GAP: 5 (ref 5–15)
BUN: 12 mg/dL (ref 8–23)
CHLORIDE: 108 mmol/L (ref 98–111)
CO2: 26 mmol/L (ref 22–32)
Calcium: 8.6 mg/dL — ABNORMAL LOW (ref 8.9–10.3)
Creatinine, Ser: 0.55 mg/dL (ref 0.44–1.00)
Glucose, Bld: 108 mg/dL — ABNORMAL HIGH (ref 70–99)
POTASSIUM: 4.2 mmol/L (ref 3.5–5.1)
SODIUM: 139 mmol/L (ref 135–145)

## 2017-10-16 LAB — CBC
HEMATOCRIT: 37.8 % (ref 36.0–46.0)
HEMOGLOBIN: 12.7 g/dL (ref 12.0–15.0)
MCH: 30.2 pg (ref 26.0–34.0)
MCHC: 33.6 g/dL (ref 30.0–36.0)
MCV: 90 fL (ref 80.0–100.0)
PLATELETS: 223 10*3/uL (ref 150–400)
RBC: 4.2 MIL/uL (ref 3.87–5.11)
RDW: 12.7 % (ref 11.5–15.5)
WBC: 7.3 10*3/uL (ref 4.0–10.5)

## 2017-10-16 LAB — LIPID PANEL
CHOL/HDL RATIO: 3.1 ratio
CHOLESTEROL: 161 mg/dL (ref 0–200)
Cholesterol: 159 mg/dL (ref 0–200)
HDL: 53 mg/dL (ref >40)
HDL: 51 mg/dL (ref >40)
LDL Cholesterol: 99 mg/dL (ref 0–99)
LDL Cholesterol: 99 mg/dL (ref 0–99)
TRIGLYCERIDES: 45 mg/dL (ref <150)
Total CHOL/HDL Ratio: 3 RATIO
Triglycerides: 44 mg/dL (ref <150)
VLDL: 9 mg/dL (ref 0–40)
VLDL: 9 mg/dL (ref 0–40)

## 2017-10-16 LAB — TROPONIN I: Troponin I: <0.03 ng/mL (ref <0.03)

## 2017-10-16 LAB — HEMOGLOBIN A1C
HEMOGLOBIN A1C: 5.6 % (ref 4.8–5.6)
MEAN PLASMA GLUCOSE: 114 mg/dL

## 2017-10-16 LAB — HIV ANTIBODY (ROUTINE TESTING W REFLEX): HIV SCREEN 4TH GENERATION: NONREACTIVE

## 2017-10-16 MED ORDER — CARVEDILOL 12.5 MG PO TABS: 12.5000 mg | ORAL_TABLET | Freq: Two times a day (BID) | ORAL | 1 refills | Status: DC

## 2017-10-16 MED ORDER — MELOXICAM 7.5 MG PO TABS: 7.5000 mg | ORAL_TABLET | Freq: Every day | ORAL | Status: DC

## 2017-10-16 NOTE — Progress Notes (Signed)
Pt's IV catheter removed and intact. Pt's IV site clean dry and intact. Discharge instructions including medications and follow up appointments were discussed with patient. All questions were answered and no further questions at this time. Pt in stable condition and in no acute distress at time of discharge. Pt escorted by nurse tech

## 2017-10-16 NOTE — Discharge Summary (Signed)
Physician Discharge Summary  Kristina Sloan CWC:[id number] DOB: 21-Apr-1945 DOA: 10/15/2017  PCP: Sharilyn Sites, MD  Admit date: 10/15/2017 Discharge date: 10/16/2017  Time spent: 35 minutes  Recommendations for Outpatient Follow-up:  1. Repeat basic metabolic panel to follow electrolytes and renal function 2. Reassess blood pressure and adjust antihypertensive regimen as needed.   Discharge Diagnoses:  Active Problems:   Chest pain   Scoliosis of thoracic spine   Anxiety Essential hypertension History of breast cancer (status post lumpectomy and radiotherapy). Dyslipidemia  Discharge Condition: Stable and improved.  Patient discharged home with instruction to follow-up with PCP in 10 days.  Diet recommendation: Heart healthy diet.  Filed Weights   10/15/17 1223 10/15/17 1513  Weight: 69.1 kg 67.3 kg    History of present illness:  72 y.o. female with a past medical history significant for breast cancer (status post lumpectomy and radiation therapy), hypertension, dyslipidemia and degenerative disc disease/scoliosis; who presented to the hospital secondary to chest pain.  Patient describes pain in the middle of her chest, substernal area, radiated to her back and ascending to her jaw, pressure/tightness feeling, she reported as an elephant was standing on her chest; 8 out of 10 in intensity, lasted approximately 20-30 minutes, resolve completely after receiving full dose aspirin in the ED.  There was no shortness of breath, palpitations, diaphoresis, nausea or vomiting associated with the pain. Patient also denies abdominal pain, fever, chills, cough, dysuria, hematuria, focal weakness, headache, blurred vision or any other complaints.  In the ED after aspirin was given patient was no complaining of any other discomfort and was only found with elevated blood pressure.  EKG without acute ischemic changes, troponin negative x1.  TRH has been called to place patient in observation for  chest pain rule out.  Hospital Course:  1-chest pain: Heart score 3-4. -Patient remained chest pain-free and denies any shortness of breath throughout hospitalization -Troponins negative x4 no acute abnormalities appreciated on EKG or telemetry -2D echo reassuring with preserved ejection fraction, no wall motion abnormalities and no significant valvular disease appreciated. -A1c within normal limits, TSH, lipid panel also unremarkable. -Patient will continue using aspirin and her blood pressure medication has been adjusted including now at discharge carvedilol twice a day. -Patient has been advised to follow heart healthy diet.  2-history of breast cancer -Appears to be in remission -Continue outpatient follow-up with surveillance with oncology service.  3-hypertension -Uncontrolled -Patient will continue the use of Cardizem as previously prescribed and will be adding 12.5 mg twice a day of carvedilol. -Patient has been advised to follow heart healthy diet. -Follow-up with PCP to reassess blood pressure and further adjust antihypertensive regimen as needed.  4-scoliosis/musculoskeletal pain -No significant abnormalities appreciated on x-ray 8 of her back -Patient receive a steroid injection and also injected pain medication and her PCP office on 10/15/2017 -Will continue the use of Mobic -Advised to increase activity as tolerated.  5-anxiety -Stable mood -Continue as needed Xanax  Procedures:  2D echo: Preserved ejection fraction, no wall motion abnormalities   Consultations:  None   Discharge Exam: Vitals:   10/15/17 2106 10/16/17 0540  BP: (!) 158/95 (!) 132/91  Pulse: 68 78  Resp: 17 17  Temp: 98.5 F (36.9 C) 98.7 F (37.1 C)  SpO2: 97% 97%    General: Afebrile, no chest pain, no shortness of breath, no nausea, no vomiting.  Patient reports feeling good and ready to go home. Cardiovascular: S1 and S2, no rubs, no gallops, no murmurs.  No JVD on  exam. Respiratory: Clear to auscultation bilaterally Abdomen: Soft, nontender, nondistended, positive bowel sounds. Extremities: No edema, no cyanosis, no clubbing.  Discharge Instructions   Discharge Instructions    Diet - low sodium heart healthy   Complete by:  As directed    Discharge instructions   Complete by:  As directed    Take medications as prescribed Arrange follow up with PCP in 10 days Keep yourself well hydrated Follow heart healthy diet     Allergies as of 10/16/2017   No Known Allergies     Medication List    TAKE these medications   ALPRAZolam 1 MG tablet Commonly known as:  XANAX Take 0.25-1 mg by mouth 3 (three) times daily as needed for anxiety.   aspirin 81 MG tablet Take 81 mg by mouth daily.   Biotin 5000 MCG Tabs Take 1 tablet by mouth daily.   CALCIUM 600 + D PO Take 1 tablet by mouth daily.   carvedilol 12.5 MG tablet Commonly known as:  COREG Take 1 tablet (12.5 mg total) by mouth 2 (two) times daily with a meal.   Co Q 10 100 MG Caps Take 100 mg by mouth daily.   diltiazem 240 MG 24 hr tablet Commonly known as:  CARDIZEM LA Take 1 tablet (240 mg total) by mouth daily.   docusate sodium 100 MG capsule Commonly known as:  COLACE Take 100 mg by mouth 2 (two) times daily.   FIBER-LAX PO Take 1 Dose by mouth daily as needed (for constipation).   fish oil-omega-3 fatty acids 1000 MG capsule Take 2,000 mg by mouth daily.   Magnesium 250 MG Tabs Take 1 tablet by mouth daily.   meloxicam 7.5 MG tablet Commonly known as:  MOBIC Take 1 tablet (7.5 mg total) by mouth daily.   Vitamin B-12 5000 MCG Tbdp Take 1 tablet by mouth daily.   Vitamin D3 2000 units capsule Take 2,000 Units by mouth daily.      No Known Allergies Follow-up Information    Sharilyn Sites, MD. Schedule an appointment as soon as possible for a visit in 10 day(s).   Specialty:  Family Medicine Contact information: [address] [place]  [phone]            The results of significant diagnostics from this hospitalization (including imaging, microbiology, ancillary and laboratory) are listed below for reference.    Significant Diagnostic Studies: Dg Chest Port 1 View  Result Date: 10/15/2017 CLINICAL DATA:  Crushing chest pain radiating to the back and jaws that began today following scoliosis radiographs. History of breast cancer. EXAM: PORTABLE CHEST 1 VIEW COMPARISON:  Chest CT dated 06/09/2016 and PET-CT dated 03/07/2016. FINDINGS: Normal sized heart. Tortuous aorta. Stable mildly elevated left hemidiaphragm and right axillary surgical clips. Stable minimal scarring at the left lateral lung base. Otherwise, the visualized lungs are clear. Mild scoliosis. IMPRESSION: No acute abnormality. Electronically Signed   By: Claudie Revering M.D.   On: 10/15/2017 13:14   Dg Scoliosis Eval Complete Spine 1 View  Result Date: 10/15/2017 CLINICAL DATA:  Back pain. Known scoliosis. EXAM: DG SCOLIOSIS EVAL COMPLETE SPINE 1V COMPARISON:  Chest CT dated 06/09/2016. FINDINGS: 29 degrees of levoconvex thoracic scoliosis with its apex at the T5-6 level. 13 degrees of dextroconvex cervical scoliosis with its apex at the C4-5 level. No congenital vertebral anomalies seen. IMPRESSION: Mild to moderate scoliosis, as described above. Electronically Signed   By: Percell Locus.D.  On: 10/15/2017 13:20   Labs: Basic Metabolic Panel: Recent Labs  Lab 10/15/17 1242 10/16/17 0403  NA 138 139  K 3.5 4.2  CL 104 108  CO2 24 26  GLUCOSE 107* 108*  BUN 8 12  CREATININE 0.51 0.55  CALCIUM 9.2 8.6*   CBC: Recent Labs  Lab 10/15/17 1242 10/16/17 0403  WBC 6.7 7.3  HGB 14.4 12.7  HCT 41.0 37.8  MCV 88.4 90.0  PLT 220 223   Cardiac Enzymes: Recent Labs  Lab 10/15/17 1242 10/15/17 1528 10/15/17 2149 10/16/17 0403  TROPONINI <0.03 <0.03 <0.03 <0.03    Signed:  Barton Dubois MD.  [facility] 10/16/2017, 8:03  AM

## 2017-11-29 NOTE — Progress Notes (Signed)
Cardiology Office Note   Date:  12/03/2017   ID:  Kristina Sloan, DOB 1945/02/17, MRN [id number]  PCP:  Sharilyn Sites, MD  Cardiologist:   Jenkins Rouge, MD   No chief complaint on file.     History of Present Illness: Kristina Sloan is a 72 y.o. female who presents for consultation regarding chest pain and question  Need for stress testing. Referred by Dr Hilma Favors Hospitalized at [facility] for chest pain 10/15/17 reviewed notes. History of HTN, HLD Breast cancer. Felt like an elephant standing on chest sudden onset radiated to jaw lasted 30 minutes Associated with abdominal pain BP was Elevated R/O ECG no acute changes TTE reviewed and normal EF no RWMA;s no valve disease D.c home no stress testing done  She indicates being very stressed that day. Had returned from trip to [place] and was waiting in Dr Delanna Ahmadi parking lot from 6:30 am until finally seen 10:30 am and then had pneumonia shot, prednisone , shot and sent to hospital for scoliosis xrays and she thinks it was all Just too much for her in one morning   Seen by Dr Ellyn Hack 2014 atypical pain and had normal myovue   She has not had issues since d/c Doing Zumba and yoga. Did have some exertional dyspnea walking with boyfriend In [place] last week    Past Medical History:  Diagnosis Date  . Breast cancer ([place]) 2003  . Breast cyst   . Cancer ([place]) 2003   rt breast 2003 lumpectomy/rad tx  . Chest pain 05/23/2012  . Complication of anesthesia    DIFFICULT WAKING ONLY ONCE  . Hard of hearing   . Hypertension   . Scoliosis     Past Surgical History:  Procedure Laterality Date  . BREAST LUMPECTOMY Right 12/2001  . COCHLEAR IMPLANT Left 2018  . COLONOSCOPY N/A 07/29/2013   Procedure: COLONOSCOPY;  Surgeon: Daneil Dolin, MD;  Location: [facility];  Service: Endoscopy;  Laterality: N/A;  2:00 PM-moved to 915 Pt notified of time change  . Left [place] Procedure  1994  . STRABISMUS SURGERY Right   . TUBAL LIGATION        Current Outpatient Medications  Medication Sig Dispense Refill  . ALPRAZolam (XANAX) 1 MG tablet Take 0.25-1 mg by mouth 3 (three) times daily as needed for anxiety.     Marland Kitchen aspirin 81 MG tablet Take 81 mg by mouth daily.    . Biotin 5000 MCG TABS Take 1 tablet by mouth daily.    . Calcium Carbonate-Vitamin D (CALCIUM 600 + D PO) Take 1 tablet by mouth daily.    . Calcium Polycarbophil (FIBER-LAX PO) Take 1 Dose by mouth daily as needed (for constipation).     . carvedilol (COREG) 12.5 MG tablet Take 1 tablet (12.5 mg total) by mouth 2 (two) times daily with a meal. 60 tablet 1  . Cholecalciferol (VITAMIN D3) 2000 units capsule Take 2,000 Units by mouth daily.    . Coenzyme Q10 (CO Q 10) 100 MG CAPS Take 100 mg by mouth daily.     . Cyanocobalamin (VITAMIN B-12) 5000 MCG TBDP Take 1 tablet by mouth daily.    Marland Kitchen diltiazem (CARDIZEM LA) 240 MG 24 hr tablet Take 1 tablet (240 mg total) by mouth daily. 30 tablet 1  . docusate sodium (COLACE) 100 MG capsule Take 100 mg by mouth 2 (two) times daily.    . fish oil-omega-3 fatty acids 1000 MG capsule Take 2,000 mg  by mouth daily.     . Magnesium 250 MG TABS Take 1 tablet by mouth daily.    . meloxicam (MOBIC) 7.5 MG tablet Take 1 tablet (7.5 mg total) by mouth daily.     No current facility-administered medications for this visit.     Allergies:   Patient has no known allergies.    Social History:  The patient  reports that she quit smoking about 27 years ago. Her smoking use included cigarettes. She started smoking about 52 years ago. She has a 25.00 pack-year smoking history. She has never used smokeless tobacco. She reports that she drinks alcohol. She reports that she does not use drugs.   Family History:  The patient's family history includes Bladder Cancer in her brother and father; COPD in her mother; Hypertension in her mother.    ROS:  Please see the history of present illness.   Otherwise, review of systems are positive for none.    All other systems are reviewed and negative.    PHYSICAL EXAM: VS:  BP 132/68 (BP Location: Right Arm)   Pulse 77   Ht 5\' 5"  (1.651 m)   Wt 156 lb (70.8 kg)   SpO2 95%   BMI 25.96 kg/m  , BMI Body mass index is 25.96 kg/m. Affect appropriate Healthy:  appears stated age 89: Left  cochlear implant  Neck supple with no adenopathy JVP normal no bruits no thyromegaly Lungs clear with no wheezing and good diaphragmatic motion Heart:  S1/S2 no murmur, no rub, gallop or click PMI normal Abdomen: benighn, BS positve, no tenderness, no AAA no bruit.  No HSM or HJR Distal pulses intact with no bruits No edema Neuro non-focal Skin warm and dry No muscular weakness    EKG:  10/16/17  SR rate 83 normal ECG  12/03/17  SR rate 77 poor R wave progression    Recent Labs: 10/15/2017: TSH 0.656 10/16/2017: BUN 12; Creatinine, Ser 0.55; Hemoglobin 12.7; Platelets 223; Potassium 4.2; Sodium 139    Lipid Panel    Component Value Date/Time   CHOL 159 10/16/2017 0403   CHOL 161 10/16/2017 0403   TRIG 44 10/16/2017 0403   TRIG 45 10/16/2017 0403   HDL 51 10/16/2017 0403   HDL 53 10/16/2017 0403   CHOLHDL 3.1 10/16/2017 0403   CHOLHDL 3.0 10/16/2017 0403   VLDL 9 10/16/2017 0403   VLDL 9 10/16/2017 0403   LDLCALC 99 10/16/2017 0403   LDLCALC 99 10/16/2017 0403      Wt Readings from Last 3 Encounters:  12/03/17 156 lb (70.8 kg)  10/15/17 148 lb 4.8 oz (67.3 kg)  05/16/17 153 lb 6 oz (69.6 kg)      Other studies Reviewed: Additional studies/ records that were reviewed today include: Notes Dr Ellyn Hack 2014 And [facility] notes October ECG, labs and TTE .    ASSESSMENT AND PLAN:  1. Chest pain: atypical hospitalized October 2019 with r/o ECG normal TTE normal EF 60% With no RWMA;s  Previously normal myovue in 2014 Symptoms a bit worrisome in female With radiation to neck and jaw pain. F/u Ex Myovue   2. HTN:  Well controlled.  Continue current medications and low  sodium Dash type diet.    3. HLD continue statin labs with primary   4. Chronic Pain: from scoliosis and back issues f/u primary    Current medicines are reviewed at length with the patient today.  The patient does not have concerns regarding medicines.  The  following changes have been made:  no change  Labs/ tests ordered today include: Ex Myovue   Orders Placed This Encounter  Procedures  . EKG 12-Lead     Disposition:   FU with cardiology PRN      Signed, Jenkins Rouge, MD  12/03/2017 11:18 AM    [address], [place], [place]  [address] Phone: [phone]; Fax: [phone]

## 2017-12-03 ENCOUNTER — Encounter: Payer: Self-pay | Admitting: Cardiovascular Disease

## 2017-12-03 ENCOUNTER — Ambulatory Visit: Payer: Medicare Other | Admitting: Cardiovascular Disease

## 2017-12-03 VITALS — BP 132/68 | HR 77 | Ht 65.0 in | Wt 156.0 lb

## 2017-12-03 DIAGNOSIS — I10 Essential (primary) hypertension: Secondary | ICD-10-CM | POA: Diagnosis not present

## 2017-12-03 DIAGNOSIS — R0602 Shortness of breath: Secondary | ICD-10-CM | POA: Diagnosis not present

## 2017-12-03 DIAGNOSIS — R079 Chest pain, unspecified: Secondary | ICD-10-CM | POA: Diagnosis not present

## 2017-12-03 NOTE — Patient Instructions (Signed)
Medication Instructions:  Your physician recommends that you continue on your current medications as directed. Please refer to the Current Medication list given to you today.   Labwork: none  Testing/Procedures: Your physician has requested that you have en exercise stress myoview. For further information please visit [email or web address]. Please follow instruction sheet, as given.    Follow-Up: Your physician recommends that you schedule a follow-up appointment in: as needed, we will call with test results    Any Other Special Instructions Will Be Listed Below (If Applicable).     If you need a refill on your cardiac medications before your next appointment, please call your pharmacy.

## 2017-12-11 ENCOUNTER — Encounter (HOSPITAL_COMMUNITY)
Admission: RE | Admit: 2017-12-11 | Discharge: 2017-12-11 | Disposition: A | Discharge status: Home / Self Care | Payer: Medicare Other | Source: Ambulatory Visit | Attending: Cardiovascular Disease | Admitting: Cardiovascular Disease

## 2017-12-11 ENCOUNTER — Encounter (HOSPITAL_BASED_OUTPATIENT_CLINIC_OR_DEPARTMENT_OTHER)
Admission: RE | Admit: 2017-12-11 | Discharge: 2017-12-11 | Disposition: A | Discharge status: Home / Self Care | Payer: Medicare Other | Source: Ambulatory Visit | Attending: Cardiovascular Disease | Admitting: Cardiovascular Disease

## 2017-12-11 DIAGNOSIS — R079 Chest pain, unspecified: Secondary | ICD-10-CM | POA: Diagnosis present

## 2017-12-11 DIAGNOSIS — R0602 Shortness of breath: Secondary | ICD-10-CM | POA: Diagnosis present

## 2017-12-11 LAB — NM MYOCAR MULTI W/SPECT W/WALL MOTION / EF
CHL CUP NUCLEAR SRS: 0
CHL CUP RESTING HR STRESS: 71 {beats}/min
CSEPEW: 9.8 METS
Exercise duration (min): 8 min
Exercise duration (sec): 0 s
LV dias vol: 48 mL (ref 46–106)
LVSYSVOL: 13 mL
MPHR: 150 {beats}/min
Peak HR: 130 {beats}/min
Percent HR: 86 %
RATE: 0.33
RPE: 15
SDS: 1
SSS: 1
TID: 0.97

## 2017-12-11 MED ORDER — REGADENOSON 0.4 MG/5ML IV SOLN
INTRAVENOUS | Status: AC
  Filled 2017-12-11: qty 5

## 2017-12-11 MED ORDER — TECHNETIUM TC 99M TETROFOSMIN IV KIT
30.0000 | PACK | Freq: Once | INTRAVENOUS | Status: AC | PRN
  Administered 2017-12-11: 30 via INTRAVENOUS

## 2017-12-11 MED ORDER — SODIUM CHLORIDE 0.9% FLUSH
INTRAVENOUS | Status: AC
  Filled 2017-12-11: qty 10

## 2017-12-11 MED ORDER — TECHNETIUM TC 99M TETROFOSMIN IV KIT
10.0000 | PACK | Freq: Once | INTRAVENOUS | Status: AC | PRN
  Administered 2017-12-11: 10 via INTRAVENOUS

## 2017-12-12 ENCOUNTER — Other Ambulatory Visit (HOSPITAL_COMMUNITY): Payer: Self-pay | Admitting: Family Medicine

## 2017-12-12 DIAGNOSIS — M545 Low back pain, unspecified: Secondary | ICD-10-CM

## 2017-12-12 DIAGNOSIS — M419 Scoliosis, unspecified: Secondary | ICD-10-CM

## 2017-12-19 ENCOUNTER — Ambulatory Visit (HOSPITAL_COMMUNITY)
Admission: RE | Admit: 2017-12-19 | Discharge: 2017-12-19 | Disposition: A | Discharge status: Home / Self Care | Payer: Medicare Other | Source: Ambulatory Visit | Attending: Family Medicine | Admitting: Family Medicine

## 2017-12-19 DIAGNOSIS — M545 Low back pain, unspecified: Secondary | ICD-10-CM

## 2017-12-19 DIAGNOSIS — M419 Scoliosis, unspecified: Secondary | ICD-10-CM | POA: Insufficient documentation

## 2019-01-08 ENCOUNTER — Other Ambulatory Visit: Payer: Self-pay | Admitting: Family Medicine

## 2019-01-08 DIAGNOSIS — Z1231 Encounter for screening mammogram for malignant neoplasm of breast: Secondary | ICD-10-CM

## 2019-02-20 ENCOUNTER — Ambulatory Visit
Admission: RE | Admit: 2019-02-20 | Discharge: 2019-02-20 | Disposition: A | Discharge status: Home / Self Care | Payer: Medicare Other | Source: Ambulatory Visit | Attending: Family Medicine | Admitting: Family Medicine

## 2019-02-20 ENCOUNTER — Other Ambulatory Visit: Payer: Self-pay

## 2019-02-20 DIAGNOSIS — Z1231 Encounter for screening mammogram for malignant neoplasm of breast: Secondary | ICD-10-CM

## 2020-02-17 ENCOUNTER — Other Ambulatory Visit: Payer: Self-pay | Admitting: Family Medicine

## 2020-02-17 DIAGNOSIS — Z9889 Other specified postprocedural states: Secondary | ICD-10-CM

## 2020-02-17 DIAGNOSIS — Z853 Personal history of malignant neoplasm of breast: Secondary | ICD-10-CM

## 2020-03-24 ENCOUNTER — Other Ambulatory Visit: Payer: Self-pay | Admitting: Family Medicine

## 2020-03-24 DIAGNOSIS — E2839 Other primary ovarian failure: Secondary | ICD-10-CM

## 2020-04-02 ENCOUNTER — Encounter (HOSPITAL_COMMUNITY): Payer: Self-pay | Admitting: Emergency Medicine

## 2020-04-02 ENCOUNTER — Emergency Department (HOSPITAL_COMMUNITY): Payer: Medicare Other

## 2020-04-02 ENCOUNTER — Emergency Department (HOSPITAL_COMMUNITY)
Admission: EM | Admit: 2020-04-02 | Discharge: 2020-04-02 | Disposition: A | Discharge status: Home / Self Care | Payer: Medicare Other | Attending: Emergency Medicine | Admitting: Emergency Medicine

## 2020-04-02 DIAGNOSIS — Z79899 Other long term (current) drug therapy: Secondary | ICD-10-CM | POA: Insufficient documentation

## 2020-04-02 DIAGNOSIS — I952 Hypotension due to drugs: Secondary | ICD-10-CM | POA: Insufficient documentation

## 2020-04-02 DIAGNOSIS — Z7982 Long term (current) use of aspirin: Secondary | ICD-10-CM | POA: Insufficient documentation

## 2020-04-02 DIAGNOSIS — I10 Essential (primary) hypertension: Secondary | ICD-10-CM | POA: Diagnosis not present

## 2020-04-02 DIAGNOSIS — M25512 Pain in left shoulder: Secondary | ICD-10-CM | POA: Diagnosis not present

## 2020-04-02 DIAGNOSIS — Z87891 Personal history of nicotine dependence: Secondary | ICD-10-CM | POA: Insufficient documentation

## 2020-04-02 DIAGNOSIS — M25511 Pain in right shoulder: Secondary | ICD-10-CM | POA: Insufficient documentation

## 2020-04-02 DIAGNOSIS — R001 Bradycardia, unspecified: Secondary | ICD-10-CM | POA: Diagnosis not present

## 2020-04-02 DIAGNOSIS — M546 Pain in thoracic spine: Secondary | ICD-10-CM | POA: Insufficient documentation

## 2020-04-02 DIAGNOSIS — Z853 Personal history of malignant neoplasm of breast: Secondary | ICD-10-CM | POA: Diagnosis not present

## 2020-04-02 DIAGNOSIS — T50905A Adverse effect of unspecified drugs, medicaments and biological substances, initial encounter: Secondary | ICD-10-CM

## 2020-04-02 DIAGNOSIS — H5712 Ocular pain, left eye: Secondary | ICD-10-CM | POA: Insufficient documentation

## 2020-04-02 LAB — CBC
HCT: 37.9 % (ref 36.0–46.0)
Hemoglobin: 12.4 g/dL (ref 12.0–15.0)
MCH: 30.8 pg (ref 26.0–34.0)
MCHC: 32.7 g/dL (ref 30.0–36.0)
MCV: 94.3 fL (ref 80.0–100.0)
Platelets: 269 10*3/uL (ref 150–400)
RBC: 4.02 MIL/uL (ref 3.87–5.11)
RDW: 11.9 % (ref 11.5–15.5)
WBC: 6.5 10*3/uL (ref 4.0–10.5)
nRBC: 0 % (ref 0.0–0.2)

## 2020-04-02 LAB — BASIC METABOLIC PANEL
Anion gap: 6 (ref 5–15)
BUN: 14 mg/dL (ref 8–23)
CO2: 26 mmol/L (ref 22–32)
Calcium: 8.9 mg/dL (ref 8.9–10.3)
Chloride: 104 mmol/L (ref 98–111)
Creatinine, Ser: 0.79 mg/dL (ref 0.44–1.00)
GFR, Estimated: >60 mL/min (ref >60)
Glucose, Bld: 114 mg/dL — ABNORMAL HIGH (ref 70–99)
Potassium: 5.2 mmol/L — ABNORMAL HIGH (ref 3.5–5.1)
Sodium: 136 mmol/L (ref 135–145)

## 2020-04-02 LAB — TROPONIN I (HIGH SENSITIVITY)
Troponin I (High Sensitivity): <2 ng/L (ref <18)
Troponin I (High Sensitivity): <2 ng/L (ref <18)

## 2020-04-02 NOTE — ED Provider Notes (Signed)
I saw and evaluated the patient, reviewed the resident's note and I agree with the findings and plan.  EKG Interpretation  Date/Time:  Friday April 02 2020 10:51:17 EDT Ventricular Rate:  54 PR Interval:  194 QRS Duration: 72 QT Interval:  432 QTC Calculation: 409 R Axis:   60 Text Interpretation: Sinus bradycardia Low voltage QRS Borderline ECG Confirmed by Lacretia Leigh ([id number]) on 04/02/2020 11:19:42 AM   75 year old female presents with heaviness in her bilateral shoulders.  Denies any chest pain or chest pressure.  Patient states she was hypotensive and bradycardic initially.  She does take a beta-blocker.  Symptoms resolved for several hours.  Suspect she has some beta-blocker toxicity.  Will adjust medications and check labs and likely discharge   Lacretia Leigh, MD 04/02/20 1216

## 2020-04-02 NOTE — Discharge Instructions (Addendum)
Ms. Kristina Sloan, it was a pleasure taking care of you. You were evaluated for pressure in your shoulder, and also bradycardia with hypotension. Your testing here was reassuring with a normal chest x-ray, EKG, and troponin. There was no sign of a heart attack at this time.  I believe that we need to adjust your medications. You are taking both diltiazem and carvedilol, which can lower your heart rate and blood pressure. Please stop the diltiazem (Matzin) and follow up with your PCP to see if you need another blood pressure medication.  Your potassium was mildly high. There were no concerning changes EKG. Please repeat this level with your PCP on follow up.

## 2020-04-02 NOTE — ED Triage Notes (Signed)
Patient reports a sudden onset of feeling a heavy weight on her shoulders that started suddenly after waking this morning. Symptom has improved but persists. Patient alert, oriented, and in no apparent distress at this time.

## 2020-04-02 NOTE — ED Provider Notes (Signed)
[facility] EMERGENCY DEPARTMENT Provider Note   CSN: [id number] Arrival date & time: 04/02/20  1043     History Chief Complaint  Patient presents with  . Chest Pain    Kristina Sloan is a 75 y.o. female with hx of breast cancer, HTN presenting for shoulder pain. She states that after waking this morning, she had pressure over both her shoulder and upper back which felt like a "weighted blanket." This pain has since resolved. It was not worse with deep breathing or exertion. Associated lightheadedness, diaphoresis, and sensation of not being able to take a deep breath. Denies nausea, vomiting, palpitations. She checked her blood pressure this morning and reports readings of 72/42, 83/55, then 100/58. She does note poor appetite for the past 4-5 months, she has lost 15 pounds in the past 2 years. She states she has felt "swimmy-headed" recently which she describes as sort of pressure and an occasion pain behind her L eye.   She was admitted for typical chest pain in 2019 with negative troponin x4 and normal TTE. Had a stress test on follow up with cardiology without evidence of ischemia. Since then has not had further episodes of chest pain. Has hx of HTN on 3 BP medications.    Past Medical History:  Diagnosis Date  . Breast cancer ([place]) 2003  . Breast cyst   . Cancer ([place]) 2003   rt breast 2003 lumpectomy/rad tx  . Chest pain 05/23/2012  . Complication of anesthesia    DIFFICULT WAKING ONLY ONCE  . Hard of hearing   . Hypertension   . Scoliosis     Patient Active Problem List   Diagnosis Date Noted  . Chest pain 10/15/2017  . Scoliosis of thoracic spine   . Anxiety   . Lung nodule < 6cm on CT 03/15/2016  . Generalized anxiety disorder 03/15/2016  . Chest pain with moderate risk for cardiac etiology 05/24/2012  . HTN (hypertension) 05/24/2012  . Hyperlipidemia 05/24/2012    Past Surgical History:  Procedure Laterality Date  . BREAST LUMPECTOMY Right  12/2001  . COCHLEAR IMPLANT Left 2018  . COLONOSCOPY N/A 07/29/2013   Procedure: COLONOSCOPY;  Surgeon: Daneil Dolin, MD;  [facility];  Service: Endoscopy;  Laterality: N/A;  2:00 PM-moved to 915 Pt notified of time change  . Left [facility] Procedure  1994  . STRABISMUS SURGERY Right   . TUBAL LIGATION       OB History   No obstetric history on file.     Family History  Problem Relation Age of Onset  . COPD Mother   . Hypertension Mother   . Bladder Cancer Father   . Bladder Cancer Brother     Social History   Tobacco Use  . Smoking status: Former Smoker    Packs/day: 1.00    Years: 25.00    Pack years: 25.00    Types: Cigarettes    Start date: 01/09/1965    Quit date: 01/09/1990    Years since quitting: 30.2  . Smokeless tobacco: Never Used  Vaping Use  . Vaping Use: Never used  Substance Use Topics  . Alcohol use: Yes    Comment: q pm, white wine  . Drug use: No    Home Medications Prior to Admission medications   Medication Sig Start Date End Date Taking? Authorizing Provider  ALPRAZolam Duanne Moron) 1 MG tablet Take 0.25-1 mg by mouth 3 (three) times daily as needed for anxiety.  [provider]  aspirin 81 MG tablet Take 81 mg by mouth daily.    [provider]  Biotin 5000 MCG TABS Take 1 tablet by mouth daily.    [provider]  Calcium Carbonate-Vitamin D (CALCIUM 600 + D PO) Take 1 tablet by mouth daily.    [provider]  Calcium Polycarbophil (FIBER-LAX PO) Take 1 Dose by mouth daily as needed (for constipation).     [provider]  carvedilol (COREG) 12.5 MG tablet Take 1 tablet (12.5 mg total) by mouth 2 (two) times daily with a meal. 10/16/17   Barton Dubois, MD  Cholecalciferol (VITAMIN D3) 2000 units capsule Take 2,000 Units by mouth daily.    [provider]  Coenzyme Q10 (CO Q 10) 100 MG CAPS Take 100 mg by mouth daily.     [provider]  Cyanocobalamin (VITAMIN B-12) 5000  MCG TBDP Take 1 tablet by mouth daily.    [provider]  cyclobenzaprine (FLEXERIL) 10 MG tablet TAKE 1 TABLET BY MOUTH THREE TIMES DAILY AS NEEDED FOR 14 DAYS 10/16/17   [provider]  docusate sodium (COLACE) 100 MG capsule Take 100 mg by mouth 2 (two) times daily.    [provider]  fish oil-omega-3 fatty acids 1000 MG capsule Take 2,000 mg by mouth daily.     [provider]  Magnesium 250 MG TABS Take 1 tablet by mouth daily.    [provider]  meloxicam (MOBIC) 7.5 MG tablet Take 1 tablet (7.5 mg total) by mouth daily. 10/16/17   Barton Dubois, MD  traMADol-acetaminophen Caroline Sauger) 37.5-325 MG tablet  11/23/17   [provider]  diltiazem (CARDIZEM LA) 240 MG 24 hr tablet Take 1 tablet (240 mg total) by mouth daily. 05/25/12 04/02/20  Dianne Dun, NP    Allergies    Patient has no known allergies.  Review of Systems   Review of Systems  Constitutional: Positive for appetite change (low apeptite over past 4-5 months) and unexpected weight change (15lb weight loss in past 2 years).  HENT: Negative for congestion and sore throat.   Eyes: Negative for pain and visual disturbance.  Respiratory: Negative for cough and shortness of breath.   Cardiovascular: Negative for chest pain, palpitations and leg swelling.       Pressure over bilateral shoulders and back  Gastrointestinal: Negative for abdominal pain, nausea and vomiting.  Neurological: Positive for dizziness. Negative for weakness and numbness.  All other systems reviewed and are negative.   Physical Exam Updated Vital Signs BP 110/66   Pulse (!) 50   Temp (!) 97.3 F (36.3 C) (Oral)   Resp 16   SpO2 99%   Physical Exam Vitals and nursing note reviewed.  Constitutional:      General: She is not in acute distress.    Appearance: She is well-developed. She is not ill-appearing.  HENT:     Head: Normocephalic and atraumatic.  Eyes:     Extraocular  Movements: Extraocular movements intact.  Neck:     Vascular: No JVD.  Cardiovascular:     Rate and Rhythm: Normal rate and regular rhythm.     Pulses:          Radial pulses are 2+ on the right side and 2+ on the left side.     Heart sounds: Normal heart sounds. Heart sounds not distant. No murmur heard. No friction rub. No gallop.   Pulmonary:     Effort: Pulmonary  effort is normal. No tachypnea, accessory muscle usage or respiratory distress.     Breath sounds: No wheezing, rhonchi or rales.  Chest:     Chest wall: No tenderness or edema.  Abdominal:     General: Bowel sounds are normal.     Palpations: Abdomen is soft.     Tenderness: There is no abdominal tenderness. There is no guarding or rebound.  Musculoskeletal:     Cervical back: Normal range of motion and neck supple.     Right lower leg: No tenderness. No edema.     Left lower leg: No tenderness. No edema.  Skin:    General: Skin is warm and dry.  Neurological:     General: No focal deficit present.     Mental Status: She is alert and oriented to person, place, and time.  Psychiatric:        Mood and Affect: Mood normal.        Behavior: Behavior normal.     ED Results / Procedures / Treatments   Labs (all labs ordered are listed, but only abnormal results are displayed) Labs Reviewed  BASIC METABOLIC PANEL - Abnormal; Notable for the following components:      Result Value   Potassium 5.2 (*)    Glucose, Bld 114 (*)    All other components within normal limits  CBC  TROPONIN I (HIGH SENSITIVITY)  TROPONIN I (HIGH SENSITIVITY)    EKG EKG Interpretation  Date/Time:  Friday April 02 2020 10:51:17 EDT Ventricular Rate:  54 PR Interval:  194 QRS Duration: 72 QT Interval:  432 QTC Calculation: 409 R Axis:   60 Text Interpretation: Sinus bradycardia Low voltage QRS Borderline ECG Confirmed by Lacretia Leigh ([id number]) on 04/02/2020 11:10:53 AM   Radiology DG Chest 2 View  Result Date:  04/02/2020 CLINICAL DATA:  Chest pain.  Hypotension. EXAM: CHEST - 2 VIEW COMPARISON:  10/15/2017 FINDINGS: Indistinct somewhat spiculated density at the right base which was not seen previously. The opacity is not clearly localized on the lateral view. No edema, effusion, or pneumothorax. Normal heart size. Postoperative right breast. Scoliosis. IMPRESSION: Spiculated density at the right base which could be infection or lesion. Depending on clinical circumstances, consider chest CT versus close radiographic follow-up after antibiotics. Electronically Signed   By: Monte Fantasia M.D.   On: 04/02/2020 11:36   ED Course  I have reviewed the triage vital signs and the nursing notes.  Pertinent labs & imaging results that were available during my care of the patient were reviewed by me and considered in my medical decision making (see chart for details).    MDM Rules/Calculators/A&P                          Patient presented with pressure to shoulders and back which started around 0700 this morning and resolved by time of presentation. Workup was benign with normal EKG, CXR, and troponin x2. ACS unlikely at this time. K mildly high at 5.2 no EKG changes.  She was noted to be bradycardic and reported hypotensive BP readings at home, though she had normal pressure during this ED evaluation. She is on both Coreg and Diltiazem. This is most likely due to toxicity from these medications. Diltiazem was stopped on discharge. She will follow up with her PCP in 1 week to recheck BP and potassium. Final Clinical Impression(s) / ED Diagnoses Final diagnoses:  Bradycardia, drug induced  Drug-induced hypotension  Rx / DC Orders ED Discharge Orders    None       Andrew Au, MD 04/02/20 1418    Lacretia Leigh, MD 04/05/20 [phone]

## 2020-04-06 ENCOUNTER — Other Ambulatory Visit: Payer: Self-pay

## 2020-04-06 ENCOUNTER — Ambulatory Visit
Admission: RE | Admit: 2020-04-06 | Discharge: 2020-04-06 | Disposition: A | Discharge status: Home / Self Care | Payer: Medicare Other | Source: Ambulatory Visit | Attending: Family Medicine | Admitting: Family Medicine

## 2020-04-06 DIAGNOSIS — Z9889 Other specified postprocedural states: Secondary | ICD-10-CM

## 2020-04-06 DIAGNOSIS — Z853 Personal history of malignant neoplasm of breast: Secondary | ICD-10-CM

## 2020-04-07 ENCOUNTER — Other Ambulatory Visit: Payer: Self-pay | Admitting: Family Medicine

## 2020-04-07 ENCOUNTER — Other Ambulatory Visit (HOSPITAL_COMMUNITY): Payer: Self-pay | Admitting: Family Medicine

## 2020-04-07 DIAGNOSIS — R918 Other nonspecific abnormal finding of lung field: Secondary | ICD-10-CM

## 2020-04-15 ENCOUNTER — Other Ambulatory Visit: Payer: Self-pay

## 2020-04-15 ENCOUNTER — Ambulatory Visit (HOSPITAL_COMMUNITY)
Admission: RE | Admit: 2020-04-15 | Discharge: 2020-04-15 | Disposition: A | Discharge status: Home / Self Care | Payer: Medicare Other | Source: Ambulatory Visit | Attending: Family Medicine | Admitting: Family Medicine

## 2020-04-15 DIAGNOSIS — R918 Other nonspecific abnormal finding of lung field: Secondary | ICD-10-CM | POA: Diagnosis present

## 2020-04-15 MED ORDER — IOHEXOL 300 MG/ML  SOLN
75.0000 mL | Freq: Once | INTRAMUSCULAR | Status: AC | PRN
  Administered 2020-04-15: 75 mL via INTRAVENOUS

## 2020-06-21 ENCOUNTER — Other Ambulatory Visit: Payer: Self-pay | Admitting: Gastroenterology

## 2020-06-21 DIAGNOSIS — K639 Disease of intestine, unspecified: Secondary | ICD-10-CM

## 2020-06-21 LAB — HM COLONOSCOPY

## 2020-06-29 ENCOUNTER — Other Ambulatory Visit: Payer: Self-pay | Admitting: Family Medicine

## 2020-06-29 DIAGNOSIS — R918 Other nonspecific abnormal finding of lung field: Secondary | ICD-10-CM

## 2020-07-06 ENCOUNTER — Ambulatory Visit
Admission: RE | Admit: 2020-07-06 | Discharge: 2020-07-06 | Disposition: A | Discharge status: Home / Self Care | Payer: Medicare Other | Source: Ambulatory Visit | Attending: Family Medicine | Admitting: Family Medicine

## 2020-07-06 ENCOUNTER — Other Ambulatory Visit: Payer: Self-pay

## 2020-07-06 ENCOUNTER — Ambulatory Visit
Admission: RE | Admit: 2020-07-06 | Discharge: 2020-07-06 | Disposition: A | Discharge status: Home / Self Care | Payer: Medicare Other | Source: Ambulatory Visit | Attending: Gastroenterology | Admitting: Gastroenterology

## 2020-07-06 DIAGNOSIS — K639 Disease of intestine, unspecified: Secondary | ICD-10-CM

## 2020-07-06 DIAGNOSIS — R918 Other nonspecific abnormal finding of lung field: Secondary | ICD-10-CM

## 2020-07-06 MED ORDER — IOPAMIDOL (ISOVUE-300) INJECTION 61%
100.0000 mL | Freq: Once | INTRAVENOUS | Status: AC | PRN
  Administered 2020-07-06: 100 mL via INTRAVENOUS

## 2020-09-02 ENCOUNTER — Ambulatory Visit
Admission: RE | Admit: 2020-09-02 | Discharge: 2020-09-02 | Disposition: A | Discharge status: Home / Self Care | Payer: Medicare Other | Source: Ambulatory Visit | Attending: Family Medicine | Admitting: Family Medicine

## 2020-09-02 ENCOUNTER — Other Ambulatory Visit: Payer: Self-pay

## 2020-09-02 DIAGNOSIS — E2839 Other primary ovarian failure: Secondary | ICD-10-CM

## 2020-09-08 ENCOUNTER — Other Ambulatory Visit: Payer: Self-pay | Admitting: Surgery

## 2020-09-10 ENCOUNTER — Other Ambulatory Visit: Payer: Self-pay | Admitting: Surgery

## 2020-09-10 DIAGNOSIS — Z8719 Personal history of other diseases of the digestive system: Secondary | ICD-10-CM

## 2020-09-14 ENCOUNTER — Ambulatory Visit
Admission: RE | Admit: 2020-09-14 | Discharge: 2020-09-14 | Disposition: A | Discharge status: Home / Self Care | Payer: Medicare Other | Source: Ambulatory Visit | Attending: Surgery | Admitting: Surgery

## 2020-09-14 DIAGNOSIS — Z8719 Personal history of other diseases of the digestive system: Secondary | ICD-10-CM

## 2020-10-13 ENCOUNTER — Other Ambulatory Visit (HOSPITAL_COMMUNITY): Payer: Self-pay | Admitting: Family Medicine

## 2020-10-13 ENCOUNTER — Other Ambulatory Visit: Payer: Self-pay

## 2020-10-13 ENCOUNTER — Ambulatory Visit (HOSPITAL_COMMUNITY)
Admission: RE | Admit: 2020-10-13 | Discharge: 2020-10-13 | Disposition: A | Discharge status: Home / Self Care | Payer: Medicare Other | Source: Ambulatory Visit | Attending: Family Medicine | Admitting: Family Medicine

## 2020-10-13 DIAGNOSIS — M25551 Pain in right hip: Secondary | ICD-10-CM | POA: Diagnosis present

## 2021-02-23 ENCOUNTER — Other Ambulatory Visit (HOSPITAL_COMMUNITY): Payer: Self-pay | Admitting: Family Medicine

## 2021-02-23 DIAGNOSIS — M5116 Intervertebral disc disorders with radiculopathy, lumbar region: Secondary | ICD-10-CM

## 2021-03-02 ENCOUNTER — Other Ambulatory Visit: Payer: Self-pay

## 2021-03-02 ENCOUNTER — Ambulatory Visit (HOSPITAL_COMMUNITY)
Admission: RE | Admit: 2021-03-02 | Discharge: 2021-03-02 | Disposition: A | Discharge status: Home / Self Care | Payer: Medicare Other | Source: Ambulatory Visit | Attending: Family Medicine | Admitting: Family Medicine

## 2021-03-02 DIAGNOSIS — M5116 Intervertebral disc disorders with radiculopathy, lumbar region: Secondary | ICD-10-CM | POA: Insufficient documentation

## 2021-03-08 ENCOUNTER — Other Ambulatory Visit: Payer: Self-pay | Admitting: Family Medicine

## 2021-03-08 DIAGNOSIS — Z1231 Encounter for screening mammogram for malignant neoplasm of breast: Secondary | ICD-10-CM

## 2021-04-07 ENCOUNTER — Ambulatory Visit
Admission: RE | Admit: 2021-04-07 | Discharge: 2021-04-07 | Disposition: A | Discharge status: Home / Self Care | Payer: Medicare Other | Source: Ambulatory Visit | Attending: Family Medicine | Admitting: Family Medicine

## 2021-04-07 DIAGNOSIS — Z1231 Encounter for screening mammogram for malignant neoplasm of breast: Secondary | ICD-10-CM

## 2021-06-29 ENCOUNTER — Emergency Department (HOSPITAL_COMMUNITY)
Admission: EM | Admit: 2021-06-29 | Discharge: 2021-06-29 | Disposition: A | Discharge status: Home / Self Care | Payer: Medicare Other | Attending: Emergency Medicine | Admitting: Emergency Medicine

## 2021-06-29 ENCOUNTER — Emergency Department (HOSPITAL_COMMUNITY): Payer: Medicare Other

## 2021-06-29 ENCOUNTER — Encounter (HOSPITAL_COMMUNITY): Payer: Self-pay

## 2021-06-29 DIAGNOSIS — Z853 Personal history of malignant neoplasm of breast: Secondary | ICD-10-CM | POA: Insufficient documentation

## 2021-06-29 DIAGNOSIS — M7989 Other specified soft tissue disorders: Secondary | ICD-10-CM | POA: Diagnosis not present

## 2021-06-29 DIAGNOSIS — I10 Essential (primary) hypertension: Secondary | ICD-10-CM | POA: Insufficient documentation

## 2021-06-29 DIAGNOSIS — S4991XA Unspecified injury of right shoulder and upper arm, initial encounter: Secondary | ICD-10-CM | POA: Diagnosis present

## 2021-06-29 DIAGNOSIS — X58XXXA Exposure to other specified factors, initial encounter: Secondary | ICD-10-CM | POA: Insufficient documentation

## 2021-06-29 DIAGNOSIS — Z7982 Long term (current) use of aspirin: Secondary | ICD-10-CM | POA: Insufficient documentation

## 2021-06-29 DIAGNOSIS — Z79899 Other long term (current) drug therapy: Secondary | ICD-10-CM | POA: Diagnosis not present

## 2021-06-29 DIAGNOSIS — S40021A Contusion of right upper arm, initial encounter: Secondary | ICD-10-CM | POA: Diagnosis not present

## 2021-06-29 NOTE — Discharge Instructions (Signed)
Everything looks okay and it might just be fat necrosis.  However if it continues to bother you you should have repeat ultrasound in 6 to 8 weeks.

## 2021-06-29 NOTE — ED Provider Notes (Signed)
[facility] DEPT Provider Note   CSN: [id number] Arrival date & time: 06/29/21  1102     History  Chief Complaint  Patient presents with   Arm Injury    Kristina Sloan is a 76 y.o. female.  Patient is a 76 year old female with a history of prior breast cancer, hypertension who is presenting today with a large bruise on her right upper arm.  She reports for years she has had a knot there which she is not exactly sure what it is but it has never caused any problems and yesterday when she was in the shower bathing she noticed a very large bruise and swelling over that area.  She reports its improved today but not gone.  She has minimal soreness on the area.  She has not noticed any swelling otherwise of her arm and denies any numbness or tingling of her hands.  She went to urgent care today and they reported she needed to come here for further imaging.  She takes no anticoagulation.  The history is provided by the patient.  Arm Injury      Home Medications Prior to Admission medications   Medication Sig Start Date End Date Taking? Authorizing Provider  ALPRAZolam Duanne Moron) 1 MG tablet Take 0.25-1 mg by mouth 3 (three) times daily as needed for anxiety.     [provider]  aspirin 81 MG tablet Take 81 mg by mouth daily.    [provider]  Biotin 5000 MCG TABS Take 1 tablet by mouth daily.    [provider]  Calcium Carbonate-Vitamin D (CALCIUM 600 + D PO) Take 1 tablet by mouth daily.    [provider]  Calcium Polycarbophil (FIBER-LAX PO) Take 1 Dose by mouth daily as needed (for constipation).     [provider]  carvedilol (COREG) 12.5 MG tablet Take 1 tablet (12.5 mg total) by mouth 2 (two) times daily with a meal. 10/16/17   Barton Dubois, MD  Cholecalciferol (VITAMIN D3) 2000 units capsule Take 2,000 Units by mouth daily.    [provider]  Coenzyme Q10 (CO Q 10) 100 MG CAPS Take 100 mg by  mouth daily.     [provider]  Cyanocobalamin (VITAMIN B-12) 5000 MCG TBDP Take 1 tablet by mouth daily.    [provider]  cyclobenzaprine (FLEXERIL) 10 MG tablet TAKE 1 TABLET BY MOUTH THREE TIMES DAILY AS NEEDED FOR 14 DAYS 10/16/17   [provider]  docusate sodium (COLACE) 100 MG capsule Take 100 mg by mouth 2 (two) times daily.    [provider]  fish oil-omega-3 fatty acids 1000 MG capsule Take 2,000 mg by mouth daily.     [provider]  Magnesium 250 MG TABS Take 1 tablet by mouth daily.    [provider]  meloxicam (MOBIC) 7.5 MG tablet Take 1 tablet (7.5 mg total) by mouth daily. 10/16/17   Barton Dubois, MD  traMADol-acetaminophen Caroline Sauger) 37.5-325 MG tablet  11/23/17   [provider]  diltiazem (CARDIZEM LA) 240 MG 24 hr tablet Take 1 tablet (240 mg total) by mouth daily. 05/25/12 04/02/20  Dianne Dun, NP      Allergies    Patient has no known allergies.    Review of Systems   Review of Systems  Physical Exam Updated Vital Signs BP (!) 171/97 (BP Location: Left Arm)   Pulse 78   Temp 98 F (36.7 C) (Oral)   Resp  18   Ht '5\' 5"'$  (1.651 m)   Wt 65.8 kg   SpO2 97%   BMI 24.13 kg/m  Physical Exam Vitals and nursing note reviewed.  HENT:     Head: Normocephalic.  Eyes:     Pupils: Pupils are equal, round, and reactive to light.  Cardiovascular:     Rate and Rhythm: Normal rate.     Pulses: Normal pulses.  Pulmonary:     Effort: Pulmonary effort is normal.  Musculoskeletal:       Arms:  Skin:    General: Skin is warm and dry.  Neurological:     General: No focal deficit present.     Mental Status: She is alert.  Psychiatric:        Mood and Affect: Mood normal.     ED Results / Procedures / Treatments   Labs (all labs ordered are listed, but only abnormal results are displayed) Labs Reviewed - No data to display  EKG None  Radiology [place] RT UPPER EXTREM LTD SOFT TISSUE  NON VASCULAR  Result Date: 06/29/2021 CLINICAL DATA:  Lumps right mid anterior arm with black/discoloration for 1 day. EXAM: ULTRASOUND RIGHT UPPER EXTREMITY LIMITED TECHNIQUE: Ultrasound examination of the upper extremity soft tissues was performed in the area of clinical concern. COMPARISON:  None Available. FINDINGS: There is ill-defined hyperechoic area in the subcutaneous soft tissues measuring approximately 3.8 x 1.3 x 4.2 cm. There are no well-defined borders. There is no increased vascularity. There are few small cysts like structures in the area. IMPRESSION: Ill-defined echogenic avascular subcutaneous soft tissue area measuring approximately 3.8 x 1.3 x 4.2 cm. The findings most consistent with fat necrosis. No evidence of mass or drainable fluid collection/abscess. Follow-up examination in 8-12 weeks would be helpful if symptoms persist. Electronically Signed   By: Keane Police D.O.   On: 06/29/2021 12:57   DG Humerus Right  Result Date: 06/29/2021 CLINICAL DATA:  Swelling and pain EXAM: RIGHT HUMERUS - 2 VIEW COMPARISON:  Chest CT dated July 06, 2020 FINDINGS: There is no evidence of fracture or other focal bone lesions. Soft tissue swelling of the mid upper arm. Nodular opacity of the right lower lung which correlates with pulmonary nodule seen on prior chest CT. IMPRESSION: No acute osseous abnormality. Electronically Signed   By: Yetta Glassman M.D.   On: 06/29/2021 12:47    Procedures Procedures    Medications Ordered in ED Medications - No data to display  ED Course/ Medical Decision Making/ A&P                           Medical Decision Making Amount and/or Complexity of Data Reviewed Radiology: ordered and independent interpretation performed. Decision-making details documented in ED Course.   Patient presenting today for bruising and enlarged area of swelling to her right deltoid area.  She reports there is been a knot there for years but yesterday noticed bruising and  more swelling.  Minimal tenderness with palpation.  Suspect possible ruptured cyst or injury to a fatty lipoma.  No evidence to suggest infectious etiology.  Low suspicion for DVT.  Patient denies any trauma and is only on 81 mg aspirin. I have independently visualized and interpreted pt's images today. Humerus images normal without acute findings.  Ultrasound shows a dense area.  Radiology reports ill-defined echogenic avascular subcutaneous soft tissue area measuring 3 x 1 x 4 cm most consistent with fat necrosis but no evidence  of a drainable collection.  Findings discussed with the patient and did encourage her to follow-up with PCP for repeat ultrasound if she has any ongoing issues.  Otherwise she is clear for discharge.        Final Clinical Impression(s) / ED Diagnoses Final diagnoses:  Fat necrosis of skin    Rx / DC Orders ED Discharge Orders     None         Blanchie Dessert, MD 06/29/21 1321

## 2021-06-29 NOTE — ED Triage Notes (Signed)
Pt states that last night in the shower she noticed bruising and swelling to R deltoid area. Denies known injury. Pt states [facility] was [facility] and referred to ED for [place]. No blood thinners.

## 2022-03-16 ENCOUNTER — Other Ambulatory Visit: Payer: Self-pay | Admitting: Family Medicine

## 2022-03-16 DIAGNOSIS — Z1231 Encounter for screening mammogram for malignant neoplasm of breast: Secondary | ICD-10-CM

## 2022-04-28 ENCOUNTER — Ambulatory Visit
Admission: RE | Admit: 2022-04-28 | Discharge: 2022-04-28 | Disposition: A | Discharge status: Home / Self Care | Payer: Medicare Other | Source: Ambulatory Visit | Attending: Family Medicine | Admitting: Family Medicine

## 2022-04-28 DIAGNOSIS — Z1231 Encounter for screening mammogram for malignant neoplasm of breast: Secondary | ICD-10-CM

## 2022-12-12 ENCOUNTER — Ambulatory Visit: Payer: Medicare Other | Admitting: Physician Assistant

## 2022-12-12 VITALS — BP 143/90 | HR 57 | Temp 97.6°F | Ht 64.0 in | Wt 145.1 lb

## 2022-12-12 DIAGNOSIS — R4789 Other speech disturbances: Secondary | ICD-10-CM | POA: Diagnosis not present

## 2022-12-12 DIAGNOSIS — I10 Essential (primary) hypertension: Secondary | ICD-10-CM

## 2022-12-12 DIAGNOSIS — R2689 Other abnormalities of gait and mobility: Secondary | ICD-10-CM | POA: Diagnosis not present

## 2022-12-12 DIAGNOSIS — M419 Scoliosis, unspecified: Secondary | ICD-10-CM

## 2022-12-12 NOTE — Progress Notes (Unsigned)
New patient visit   Patient: Kristina Sloan   DOB: Nov 05, 1945   77 y.o. Female  MRN: [id number] Visit Date: 12/12/2022  Today's healthcare provider: Alfredia Ferguson, PA-C   No chief complaint on file.  Subjective    Kristina Sloan is a 77 y.o. female who presents today as a new patient to establish care.   HTN -- coreg 12.5 mg bid dilt 240  Xanax ?  Hld but no statin  Balance procedure ear 1994   Deaf left ear, hearing aids b/l doesn't use cochlear Spine specialist, on meloxicam ;   Word finding issues  Arthritis in spine, and scoliosis  -taking gabapentin 600  -tramadol  -meloxicam 15   Past Medical History:  Diagnosis Date   Breast cancer ([facility]) 2003   Breast cyst    Cancer ([facility]) 2003   rt breast 2003 lumpectomy/rad tx   Chest pain 05/23/2012   Complication of anesthesia    DIFFICULT WAKING ONLY ONCE   Hard of hearing    Hypertension    Scoliosis    Past Surgical History:  Procedure Laterality Date   BREAST LUMPECTOMY Right 12/2001   COCHLEAR IMPLANT Left 2018   COLONOSCOPY N/A 07/29/2013   Procedure: COLONOSCOPY;  Surgeon: Corbin Ade, MD;  [facility];  Service: Endoscopy;  Laterality: N/A;  2:00 PM-moved to 915 Pt notified of time change   Left [facility] Procedure  1994   STRABISMUS SURGERY Right    TUBAL LIGATION     Family Status  Relation Name Status   Mother  Alive   Father  Deceased   MGM  Deceased   MGF  Deceased   PGM  Deceased   PGF  Deceased   Brother  Alive   Neg Hx  (Not Specified)  No partnership data on file   Family History  Problem Relation Age of Onset   COPD Mother    Hypertension Mother    Bladder Cancer Father    Bladder Cancer Brother    Breast cancer Neg Hx    Social History   Socioeconomic History   Marital status: Married    Spouse name: Not on file   Number of children: 0   Years of education: Not on file   Highest education level: Not on file  Occupational History   Occupation: retired   Tobacco Use   Smoking status: Former    Current packs/day: 0.00    Average packs/day: 1 pack/day for 25.0 years (25.0 ttl pk-yrs)    Types: Cigarettes    Start date: 01/09/1965    Quit date: 01/09/1990    Years since quitting: 32.9   Smokeless tobacco: Never  Vaping Use   Vaping status: Never Used  Substance and Sexual Activity   Alcohol use: Yes    Comment: q pm, white wine   Drug use: No   Sexual activity: Not on file  Other Topics Concern   Not on file  Social History Narrative   [facility] (03/15/16):   Originally from [place]. She has lived in [place] since 1951. She grew up with her mother. She has traveled to [place] (in June), [place], [place], [place], [place], [place], & multiple [place] states. Previously worked in a Field se[employer]ologist. Currently has a dog. No bird or mold exposure. No recent hot tub exposure. Enjoys photography.    Social Determinants of Health   Financial Resource Strain: Low Risk  (07/14/2022)   Received from [facility]  Overall Financial Resource Strain (CARDIA)    Difficulty of Paying Living Expenses: Not very hard  Food Insecurity: No Food Insecurity (07/13/2022)   Received from [facility]   Hunger Vital Sign    Worried About Running Out of Food in the Last Year: Never true    Ran Out of Food in the Last Year: Never true  Transportation Needs: No Transportation Needs (07/13/2022)   Received from [facility] - Transportation    Lack of Transportation (Medical): No    Lack of Transportation (Non-Medical): No  Physical Activity: Insufficiently Active (07/13/2022)   Received from [facility]   Exercise Vital Sign    Days of Exercise per Week: 5 days    Minutes of Exercise per Session: 20 min  Stress: No Stress Concern Present (07/13/2022)   Received from [facility] - Occupational Stress Questionnaire    Feeling of Stress : Not at all  Social Connections: Socially Integrated (07/13/2022)    Received from [facility]   Social Network    How would you rate your social network (family, work, friends)?: Good participation with social networks   Outpatient Medications Prior to Visit  Medication Sig   ALPRAZolam (XANAX) 1 MG tablet Take 0.25-1 mg by mouth 3 (three) times daily as needed for anxiety.    aspirin 81 MG tablet Take 81 mg by mouth daily.   Biotin 5000 MCG TABS Take 1 tablet by mouth daily.   Calcium Carbonate-Vitamin D (CALCIUM 600 + D PO) Take 1 tablet by mouth daily.   Calcium Polycarbophil (FIBER-LAX PO) Take 1 Dose by mouth daily as needed (for constipation).    carvedilol (COREG) 12.5 MG tablet Take 1 tablet (12.5 mg total) by mouth 2 (two) times daily with a meal.   Cholecalciferol (VITAMIN D3) 2000 units capsule Take 2,000 Units by mouth daily.   Coenzyme Q10 (CO Q 10) 100 MG CAPS Take 100 mg by mouth daily.    Cyanocobalamin (VITAMIN B-12) 5000 MCG TBDP Take 1 tablet by mouth daily.   cyclobenzaprine (FLEXERIL) 10 MG tablet TAKE 1 TABLET BY MOUTH THREE TIMES DAILY AS NEEDED FOR 14 DAYS   docusate sodium (COLACE) 100 MG capsule Take 100 mg by mouth 2 (two) times daily.   fish oil-omega-3 fatty acids 1000 MG capsule Take 2,000 mg by mouth daily.    Magnesium 250 MG TABS Take 1 tablet by mouth daily.   meloxicam (MOBIC) 7.5 MG tablet Take 1 tablet (7.5 mg total) by mouth daily.   traMADol-acetaminophen (ULTRACET) 37.5-325 MG tablet    No facility-administered medications prior to visit.   No Known Allergies  Immunization History  Administered Date(s) Administered   Hepatitis A, Ped/Adol-2 Dose 04/22/2015, 08/28/2019   Hepatitis B, PED/ADOLESCENT 12/08/2015, 01/05/2016, 08/28/2019   Influenza, High Dose Seasonal PF 11/13/2016   Influenza-Unspecified 10/08/2014, 10/09/2017, 10/13/2020, 11/01/2021, 10/27/2022   MMR 02/01/2016   Pfizer(Comirnaty)Fall Seasonal Vaccine 12 years and older 02/03/2019, 02/24/2019, 10/25/2019, 05/28/2020, 11/15/2021    RSV,unspecified 12/07/2021   Td 11/13/2011   Tdap 11/13/2011    Health Maintenance  Topic Date Due   Medicare Annual Wellness (AWV)  Never done   Hepatitis C Screening  Never done   Zoster Vaccines- Shingrix (1 of 2) Never done   Pneumonia Vaccine 22+ Years old (1 of 1 - PCV) Never done   DTaP/Tdap/Td (3 - Td or Tdap) 11/12/2021   COVID-19 Vaccine (5 - 2023-24 season) 09/10/2022   Colonoscopy  07/30/2023   INFLUENZA VACCINE  Completed   DEXA SCAN  Completed   HPV VACCINES  Aged Out    Patient Care Team: Assunta Found, MD as PCP - General (Family Medicine) Wendall Stade, MD as PCP - Cardiology (Cardiology)  Review of Systems  {Insert previous labs (optional):23779} {See past labs  Heme  Chem  Endocrine  Serology  Results Review (optional):1}   Objective    There were no vitals taken for this visit. {Insert last BP/Wt (optional):23777}{See vitals history (optional):1}   Physical Exam Constitutional:      General: She is awake.     Appearance: She is well-developed.  HENT:     Head: Normocephalic.  Eyes:     Conjunctiva/sclera: Conjunctivae normal.  Cardiovascular:     Rate and Rhythm: Normal rate and regular rhythm.     Heart sounds: Normal heart sounds.  Pulmonary:     Effort: Pulmonary effort is normal.     Breath sounds: Normal breath sounds.  Skin:    General: Skin is warm.  Neurological:     Mental Status: She is alert and oriented to person, place, and time.  Psychiatric:        Attention and Perception: Attention normal.        Mood and Affect: Mood normal.        Speech: Speech normal.        Behavior: Behavior is cooperative.    ***  Depression Screen     No data to display         No results found for any visits on 12/12/22.  Assessment & Plan     There are no diagnoses linked to this encounter.   No follow-ups on file.      Alfredia Ferguson, PA-C  [facility] [phone]  (phone) [phone] (fax)  [facility]

## 2022-12-13 ENCOUNTER — Encounter: Payer: Self-pay | Admitting: Physician Assistant

## 2022-12-13 DIAGNOSIS — K219 Gastro-esophageal reflux disease without esophagitis: Secondary | ICD-10-CM | POA: Insufficient documentation

## 2022-12-13 DIAGNOSIS — R4789 Other speech disturbances: Secondary | ICD-10-CM | POA: Insufficient documentation

## 2022-12-13 DIAGNOSIS — R2689 Other abnormalities of gait and mobility: Secondary | ICD-10-CM | POA: Insufficient documentation

## 2022-12-13 DIAGNOSIS — H9192 Unspecified hearing loss, left ear: Secondary | ICD-10-CM | POA: Insufficient documentation

## 2022-12-13 NOTE — Assessment & Plan Note (Signed)
Moderately controlled, will monitor Manages with coreg 12.5 mg , diltiazem 240 mg  Will obtain records to review labs F/u 6 mo

## 2022-12-13 NOTE — Assessment & Plan Note (Signed)
Suggested referral to neurology, pt would like to hold off Recommending next visit a [facility] or MMSE

## 2022-12-13 NOTE — Assessment & Plan Note (Addendum)
Pt manages pain with gabapentin 600 mg daily, tramadol prn , meloxicam 15 mg prn.   Suggesting PRN use of all meds-- she reports a history of a tolerance to gabapentin.  Suggested PT , pt has been to PT, would like to do her exercises at home

## 2022-12-13 NOTE — Assessment & Plan Note (Signed)
Suggesting physical therapy, pt reports she has been and would rather do her exercises at home

## 2023-02-13 ENCOUNTER — Encounter: Payer: Medicare Other | Admitting: Physician Assistant

## 2023-02-20 ENCOUNTER — Encounter: Payer: Self-pay | Admitting: Physician Assistant

## 2023-02-20 ENCOUNTER — Ambulatory Visit (INDEPENDENT_AMBULATORY_CARE_PROVIDER_SITE_OTHER): Payer: Medicare Other | Admitting: Physician Assistant

## 2023-02-20 VITALS — BP 138/82 | HR 57 | Temp 97.6°F | Ht 64.0 in | Wt 143.2 lb

## 2023-02-20 DIAGNOSIS — Z131 Encounter for screening for diabetes mellitus: Secondary | ICD-10-CM

## 2023-02-20 DIAGNOSIS — E785 Hyperlipidemia, unspecified: Secondary | ICD-10-CM

## 2023-02-20 DIAGNOSIS — Z0000 Encounter for general adult medical examination without abnormal findings: Secondary | ICD-10-CM

## 2023-02-20 DIAGNOSIS — Z23 Encounter for immunization: Secondary | ICD-10-CM

## 2023-02-20 DIAGNOSIS — R4789 Other speech disturbances: Secondary | ICD-10-CM

## 2023-02-20 DIAGNOSIS — R911 Solitary pulmonary nodule: Secondary | ICD-10-CM

## 2023-02-20 DIAGNOSIS — K219 Gastro-esophageal reflux disease without esophagitis: Secondary | ICD-10-CM

## 2023-02-20 DIAGNOSIS — I10 Essential (primary) hypertension: Secondary | ICD-10-CM | POA: Diagnosis not present

## 2023-02-20 DIAGNOSIS — M419 Scoliosis, unspecified: Secondary | ICD-10-CM

## 2023-02-20 DIAGNOSIS — F411 Generalized anxiety disorder: Secondary | ICD-10-CM

## 2023-02-20 DIAGNOSIS — R2689 Other abnormalities of gait and mobility: Secondary | ICD-10-CM

## 2023-02-20 LAB — CBC WITH DIFFERENTIAL/PLATELET
Basophils Absolute: 0.1 10*3/uL (ref 0.0–0.1)
Basophils Relative: 0.9 % (ref 0.0–3.0)
Eosinophils Absolute: 0.1 10*3/uL (ref 0.0–0.7)
Eosinophils Relative: 1.3 % (ref 0.0–5.0)
HCT: 40.5 % (ref 36.0–46.0)
Hemoglobin: 13.7 g/dL (ref 12.0–15.0)
Lymphocytes Relative: 30.7 % (ref 12.0–46.0)
Lymphs Abs: 1.7 10*3/uL (ref 0.7–4.0)
MCHC: 33.8 g/dL (ref 30.0–36.0)
MCV: 88.9 fL (ref 78.0–100.0)
Monocytes Absolute: 0.6 10*3/uL (ref 0.1–1.0)
Monocytes Relative: 10.8 % (ref 3.0–12.0)
Neutro Abs: 3.1 10*3/uL (ref 1.4–7.7)
Neutrophils Relative %: 56.3 % (ref 43.0–77.0)
Platelets: 301 10*3/uL (ref 150.0–400.0)
RBC: 4.56 Mil/uL (ref 3.87–5.11)
RDW: 13.8 % (ref 11.5–15.5)
WBC: 5.5 10*3/uL (ref 4.0–10.5)

## 2023-02-20 LAB — COMPREHENSIVE METABOLIC PANEL
ALT: 7 U/L (ref 0–35)
AST: 12 U/L (ref 0–37)
Albumin: 4.3 g/dL (ref 3.5–5.2)
Alkaline Phosphatase: 117 U/L (ref 39–117)
BUN: 8 mg/dL (ref 6–23)
CO2: 32 meq/L (ref 19–32)
Calcium: 9.4 mg/dL (ref 8.4–10.5)
Chloride: 101 meq/L (ref 96–112)
Creatinine, Ser: 0.57 mg/dL (ref 0.40–1.20)
GFR: 88.97 mL/min (ref >60.00)
Glucose, Bld: 101 mg/dL — ABNORMAL HIGH (ref 70–99)
Potassium: 4.7 meq/L (ref 3.5–5.1)
Sodium: 139 meq/L (ref 135–145)
Total Bilirubin: 0.9 mg/dL (ref 0.2–1.2)
Total Protein: 7.7 g/dL (ref 6.0–8.3)

## 2023-02-20 LAB — LIPID PANEL
Cholesterol: 187 mg/dL (ref 0–200)
HDL: 63 mg/dL (ref >39.00)
LDL Cholesterol: 107 mg/dL — ABNORMAL HIGH (ref 0–99)
NonHDL: 124.31
Total CHOL/HDL Ratio: 3
Triglycerides: 89 mg/dL (ref 0.0–149.0)
VLDL: 17.8 mg/dL (ref 0.0–40.0)

## 2023-02-20 NOTE — Assessment & Plan Note (Signed)
Xanax 0.5 mg at bedtime for sleep, and prn once during the day.

## 2023-02-20 NOTE — Assessment & Plan Note (Signed)
Repeat fasting lipids No statin

## 2023-02-20 NOTE — Assessment & Plan Note (Signed)
Moderately controlled, will monitor Manages with coreg 12.5 mg, diltiazem 240 mg  Ordering cmp today F/u 6 mo advise pt monitor at home

## 2023-02-20 NOTE — Assessment & Plan Note (Signed)
W/ pt reported h/o esophageal stricture dilation. Given progressive symptoms rec pt f/b with GI Cont PPI

## 2023-02-20 NOTE — Progress Notes (Signed)
 Complete physical exam   Patient: Kristina Sloan   DOB: 1945/08/27   78 y.o. Female  MRN: [id number] Visit Date: 02/20/2023  Today's healthcare provider: Alfredia Ferguson, PA-C   Chief Complaint  Patient presents with   Annual Exam    Patient here for yearly CPE- Patient is fasting.     Subjective    Kristina Sloan is a 78 y.o. female who presents today for a complete physical exam.   She reports no longer taking meloxicam for her back pain, and only prn tramadol. She does take gabapentin 600 mg every AM.   She uses 0.5 mg xanax to sleep at night, and will occasionally take it during the day.   She reports an intermittent sensation of food getting stuck in her chest. Reports history of esophageal stricture. Last GI visit was 2-3 years ago.  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   Past Medical History:  Diagnosis Date   Anxiety 1970's   Arthritis ?   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   Left Jenetta Procedure  1994   STRABISMUS SURGERY Right    TUBAL LIGATION     Social History   Socioeconomic History   Marital status: Married    Spouse name: Not on file   Number of children: 0   Years of education: Not on file   Highest education level: Not on file  Occupational History   Occupation: retired  Tobacco Use   Smoking status: Former    Current  packs/day: 0.00    Average packs/day: 1 pack/day for 25.0 years (25.0 ttl pk-yrs)    Types: Cigarettes    Start date: 01/09/1965    Quit date: 01/09/1990    Years since quitting: 33.1   Smokeless tobacco: Never  Vaping Use   Vaping status: Never Used  Substance and Sexual Activity   Alcohol use: Yes    Alcohol/week: 7.0 standard drinks of alcohol    Types: 7 Glasses of wine per week    Comment: q pm, white wine   Drug use: No   Sexual activity: Not Currently    Birth control/protection: None    Comment: I'm 78 years old  Other Topics Concern   Not on file  Social History Narrative   [facility] (03/15/16):   Originally from [place]. She has lived in [place] since 1951. She grew up with her mother. She has traveled to [place] (in June), [place], [place], [place], [place], [place], & multiple [place] states. Previously worked in a Field se[employer]ologist. Currently has a dog. No bird or mold exposure. No recent hot tub exposure. Enjoys photography.    Social Drivers of Health   Financial Resource Strain: Low Risk  (07/14/2022)   Received  from [facility]   Overall Financial Resource Strain (CARDIA)    Difficulty of Paying Living Expenses: Not very hard  Food Insecurity: No Food Insecurity (07/13/2022)   Received from [facility]   Hunger Vital Sign    Worried About Running Out of Food in the Last Year: Never true    Ran Out of Food in the Last Year: Never true  Transportation Needs: No Transportation Needs (07/13/2022)   Received from [facility] - Transportation    Lack of Transportation (Medical): No    Lack of Transportation (Non-Medical): No  Physical Activity: Insufficiently Active (07/13/2022)   Received [facility]   Exercise Vital Sign    Days of Exercise per Week: 5 days    Minutes of Exercise per Session: 20 min  Stress: No Stress Concern Present (07/13/2022)   Received from [facility] - Occupational Stress  Questionnaire    Feeling of Stress : Not at all  Social Connections: Socially Integrated (07/13/2022)   Received from [facility]   Social Network    How would you rate your social network (family, work, friends)?: Good participation with social networks  Intimate Partner Violence: Not At Risk (07/13/2022)   Received from [facility]   HITS    Over the last 12 months how often did your partner physically Bourn you?: Never    Over the last 12 months how often did your partner insult you or talk down to you?: Never    Over the last 12 months how often did your partner threaten you with physical harm?: Never    Over the last 12 months how often did your partner scream or curse at you?: Never   Family Status  Relation Name Status   Mother Darcey Cardy Alive   Father  Deceased   MGM  Deceased   MGF  Deceased   PGM  Deceased   PGF  Deceased   Brother Maisie F[place] Alive   Neg Hx  (Not Specified)  No partnership data on file   Family History  Problem Relation Age of Onset   COPD Mother    Hypertension Mother    Bladder Cancer Father    Bladder Cancer Brother    Cancer Brother    Breast cancer Neg Hx    No Known Allergies  Patient Care Team: Alfredia Ferguson, PA-C as PCP - General (Physician Assistant) Wendall Stade, MD as PCP - Cardiology (Cardiology)   Medications: Outpatient Medications Prior to Visit  Medication Sig   ALPRAZolam (XANAX) 1 MG tablet Take 0.5 mg by mouth 2 (two) times daily as needed for anxiety.   Biotin 5000 MCG TABS Take 1 tablet by mouth daily.   carvedilol (COREG) 12.5 MG tablet Take 1 tablet (12.5 mg total) by mouth 2 (two) times daily with a meal.   Cholecalciferol (VITAMIN D3) 2000 units capsule Take 2,000 Units by mouth daily.   Cyanocobalamin (VITAMIN B-12) 5000 MCG TBDP Take 1 tablet by mouth daily.   diltiazem (DILACOR XR) 240 MG 24 hr capsule Take 240 mg by mouth daily.   gabapentin (NEURONTIN) 600 MG tablet Take 600 mg by mouth once.    pantoprazole (PROTONIX) 40 MG tablet Take 40 mg by mouth daily.   [DISCONTINUED] ALPRAZolam (XANAX) 1 MG tablet Take 0.25-1 mg by mouth 3 (three) times daily as needed for anxiety.    [DISCONTINUED] meloxicam (MOBIC) 7.5 MG tablet Take 1 tablet (7.5 mg  total) by mouth daily. (Patient taking differently: Take 15 mg by mouth daily.)   [DISCONTINUED] traMADol (ULTRAM) 50 MG tablet Take 50 mg by mouth every 6 (six) hours as needed for moderate pain (pain score 4-6).   No facility-administered medications prior to visit.    Review of Systems  Constitutional:  Negative for fatigue and fever.  HENT:  Positive for trouble swallowing.   Respiratory:  Negative for cough and shortness of breath.   Cardiovascular:  Negative for chest pain and leg swelling.  Gastrointestinal:  Negative for abdominal pain.  Neurological:  Negative for dizziness and headaches.      Objective    BP 138/82   Pulse (!) 57   Temp 97.6 F (36.4 C) (Oral)   Ht 5\' 4"  (1.626 m)   Wt 143 lb 4 oz (65 kg)   SpO2 96%   BMI 24.59 kg/m    Physical Exam Constitutional:      General: She is awake.     Appearance: She is well-developed. She is not ill-appearing.  HENT:     Head: Normocephalic.     Right Ear: Tympanic membrane normal.     Left Ear: Tympanic membrane normal.     Nose: Nose normal. No congestion or rhinorrhea.     Mouth/Throat:     Pharynx: No oropharyngeal exudate or posterior oropharyngeal erythema.  Eyes:     Conjunctiva/sclera: Conjunctivae normal.     Pupils: Pupils are equal, round, and reactive to light.  Neck:     Thyroid: No thyroid mass or thyromegaly.  Cardiovascular:     Rate and Rhythm: Normal rate and regular rhythm.     Heart sounds: Normal heart sounds.  Pulmonary:     Effort: Pulmonary effort is normal.     Breath sounds: Normal breath sounds.  Musculoskeletal:     Right lower leg: No swelling. No edema.     Left lower leg: No swelling. No edema.  Lymphadenopathy:     Cervical:  No cervical adenopathy.  Skin:    General: Skin is warm.  Neurological:     Mental Status: She is alert and oriented to person, place, and time.  Psychiatric:        Attention and Perception: Attention normal.        Mood and Affect: Mood normal.        Speech: Speech normal.        Behavior: Behavior normal. Behavior is cooperative.     Last depression screening scores    02/20/2023   10:23 AM  PHQ 2/9 Scores  PHQ - 2 Score 0   Last fall risk screening    02/20/2023   10:23 AM  Fall Risk   Falls in the past year? 0  Number falls in past yr: 0  Injury with Fall? 0  Risk for fall due to : No Fall Risks  Follow up Falls evaluation completed   Last Audit-C alcohol use screening     No data to display         A score of 3 or more in women, and 4 or more in men indicates increased risk for alcohol abuse, EXCEPT if all of the points are from question 1   No results found for any visits on 02/20/23.  Assessment & Plan    Routine Health Maintenance and Physical Exam  Exercise Activities and Dietary recommendations   --balanced diet high in fiber and protein, low in sugars, carbs, fats. --physical activity/exercise 20-30  minutes 3-5 times a week    Immunization History  Administered Date(s) Administered   Hepatitis A, Ped/Adol-2 Dose 04/22/2015, 08/28/2019   Hepatitis B, PED/ADOLESCENT 12/08/2015, 01/05/2016, 08/28/2019   Influenza, High Dose Seasonal PF 11/13/2016   Influenza-Unspecified 10/08/2014, 10/09/2017, 10/13/2020, 11/01/2021, 10/27/2022   MMR 02/01/2016   PNEUMOCOCCAL CONJUGATE-20 02/20/2023   Pfizer(Comirnaty)Fall Seasonal Vaccine 12 years and older 02/03/2019, 02/24/2019, 10/25/2019, 05/28/2020, 11/15/2021   RSV,unspecified 12/07/2021   Td 11/13/2011   Tdap 11/13/2011    Health Maintenance  Topic Date Due   Medicare Annual Wellness (AWV)  Never done   Hepatitis C Screening  Never done   Zoster Vaccines- Shingrix (1 of 2) Never done   DTaP/Tdap/Td  (3 - Td or Tdap) 11/12/2021   COVID-19 Vaccine (6 - 2024-25 season) 09/10/2022   MAMMOGRAM  04/28/2023   Colonoscopy  07/30/2023   Pneumonia Vaccine 15+ Years old  Completed   INFLUENZA VACCINE  Completed   DEXA SCAN  Completed   HPV VACCINES  Aged Out    Discussed health benefits of physical activity, and encouraged her to engage in regular exercise appropriate for her age and condition.  Problem List Items Addressed This Visit       Cardiovascular and Mediastinum   HTN (hypertension) (Chronic)   Moderately controlled, will monitor Manages with coreg 12.5 mg, diltiazem 240 mg  Ordering cmp today F/u 6 mo advise pt monitor at home      Relevant Orders   CBC w/Diff   Comp Met (CMET)     Respiratory   Lung nodule   Seen since 2018, last seen 2022 and was stable, but 3 mo CT was recommended. Pt reports they were going to bx and on that day could not locate.  No follow up since. Asymptomatic, she does have a 20 + pack year smoking history      Relevant Orders   CT CHEST LCS NODULE F/U LOW DOSE WO CONTRAST     Digestive   GERD (gastroesophageal reflux disease)   W/ pt reported h/o esophageal stricture dilation. Given progressive symptoms rec pt f/b with GI Cont PPI        Musculoskeletal and Integument   Scoliosis of thoracic spine   Intermittent thoracic back pain She takes gabapentin 600 mg daily, ok to continue, will check cmp  Ok for prn tramadol        Other   Hyperlipidemia (Chronic)   Repeat fasting lipids No statin       Relevant Orders   Lipid panel   Generalized anxiety disorder   Xanax 0.5 mg at bedtime for sleep, and prn once during the day.        Relevant Medications   ALPRAZolam (XANAX) 1 MG tablet   Balance problem   Suggesting physical therapy. Pt will consider      Word finding difficulty   Stable, will monitor      Other Visit Diagnoses       Annual physical exam    -  Primary   Relevant Orders   CBC w/Diff   Comp Met  (CMET)   Lipid panel   HgB A1c     Immunization due       Relevant Orders   Pneumococcal conjugate vaccine 20-valent (Prevnar 20) (Completed)       Recommending tdap and shingles vaccines at the pharmacy.  Return in about 6 months (around 08/20/2023) for chronic conditions.     Alfredia Ferguson, PA-C  [facility] at [facility] [phone] (phone) [phone] (fax)  [facility]

## 2023-02-20 NOTE — Assessment & Plan Note (Signed)
Seen since 2018, last seen 2022 and was stable, but 3 mo CT was recommended. Pt reports they were going to bx and on that day could not locate.  No follow up since. Asymptomatic, she does have a 20 + pack year smoking history

## 2023-02-20 NOTE — Assessment & Plan Note (Signed)
Stable, will monitor

## 2023-02-20 NOTE — Assessment & Plan Note (Signed)
Intermittent thoracic back pain She takes gabapentin 600 mg daily, ok to continue, will check cmp  Ok for prn tramadol

## 2023-02-20 NOTE — Assessment & Plan Note (Signed)
Suggesting physical therapy. Pt will consider

## 2023-02-21 ENCOUNTER — Encounter: Payer: Self-pay | Admitting: Physician Assistant

## 2023-02-21 LAB — HEMOGLOBIN A1C: Hgb A1c MFr Bld: 6.1 % (ref 4.6–6.5)

## 2023-02-27 NOTE — Addendum Note (Signed)
 Addended byAlfredia Ferguson on: 02/27/2023 03:29 PM   Modules accepted: Orders

## 2023-03-05 ENCOUNTER — Ambulatory Visit (HOSPITAL_BASED_OUTPATIENT_CLINIC_OR_DEPARTMENT_OTHER)
Admission: RE | Admit: 2023-03-05 | Discharge: 2023-03-05 | Disposition: A | Discharge status: Home / Self Care | Payer: Medicare Other | Source: Ambulatory Visit | Attending: Physician Assistant | Admitting: Physician Assistant

## 2023-03-05 ENCOUNTER — Encounter (HOSPITAL_BASED_OUTPATIENT_CLINIC_OR_DEPARTMENT_OTHER): Payer: Self-pay

## 2023-03-05 ENCOUNTER — Ambulatory Visit (HOSPITAL_BASED_OUTPATIENT_CLINIC_OR_DEPARTMENT_OTHER): Admission: RE | Admit: 2023-03-05 | Payer: Medicare Other | Source: Ambulatory Visit

## 2023-03-05 DIAGNOSIS — R911 Solitary pulmonary nodule: Secondary | ICD-10-CM | POA: Insufficient documentation

## 2023-03-12 ENCOUNTER — Ambulatory Visit: Payer: Self-pay | Admitting: Physician Assistant

## 2023-03-12 NOTE — Telephone Encounter (Signed)
 This RN made 3rd attempt to reach pt, "Call cannot be completed as dialed" message. Routing to clinic for follow up.

## 2023-03-12 NOTE — Telephone Encounter (Signed)
 Attempted to reach pt "call cannot be completed as dialed. " Attempted x2.

## 2023-03-12 NOTE — Telephone Encounter (Signed)
 Copied from CRM [id number]. Topic: Clinical - Red Word Triage >> Mar 12, 2023  8:16 AM Prudencio Pair wrote: Red Word that prompted transfer to Nurse Triage: Patient states she is having issues with her blood pressure. States she woke up at 3AM this morning with her heart pounding. Took her blood pressure medicine at 7 something this morning but her blood pressure was 158/105. Took it again at 7:45AM & it was 165/104 & pulse 84. Patient wants to know if she needs to come in and see her PCP?

## 2023-03-13 ENCOUNTER — Emergency Department (HOSPITAL_COMMUNITY)

## 2023-03-13 ENCOUNTER — Emergency Department (HOSPITAL_COMMUNITY): Admission: EM | Admit: 2023-03-13 | Discharge: 2023-03-13 | Disposition: A | Discharge status: Home / Self Care

## 2023-03-13 ENCOUNTER — Ambulatory Visit: Payer: Self-pay | Admitting: Physician Assistant

## 2023-03-13 DIAGNOSIS — I10 Essential (primary) hypertension: Secondary | ICD-10-CM | POA: Insufficient documentation

## 2023-03-13 DIAGNOSIS — R61 Generalized hyperhidrosis: Secondary | ICD-10-CM | POA: Insufficient documentation

## 2023-03-13 DIAGNOSIS — R002 Palpitations: Secondary | ICD-10-CM | POA: Insufficient documentation

## 2023-03-13 LAB — CBC WITH DIFFERENTIAL/PLATELET
Abs Immature Granulocytes: 0.01 K/uL (ref 0.00–0.07)
Basophils Absolute: 0.1 K/uL (ref 0.0–0.1)
Basophils Relative: 1 %
Eosinophils Absolute: 0 K/uL (ref 0.0–0.5)
Eosinophils Relative: 1 %
HCT: 41.8 % (ref 36.0–46.0)
Hemoglobin: 13.8 g/dL (ref 12.0–15.0)
Immature Granulocytes: 0 %
Lymphocytes Relative: 27 %
Lymphs Abs: 1.6 K/uL (ref 0.7–4.0)
MCH: 29.6 pg (ref 26.0–34.0)
MCHC: 33 g/dL (ref 30.0–36.0)
MCV: 89.7 fL (ref 80.0–100.0)
Monocytes Absolute: 0.5 K/uL (ref 0.1–1.0)
Monocytes Relative: 9 %
Neutro Abs: 3.8 K/uL (ref 1.7–7.7)
Neutrophils Relative %: 62 %
Platelets: 273 K/uL (ref 150–400)
RBC: 4.66 MIL/uL (ref 3.87–5.11)
RDW: 13.4 % (ref 11.5–15.5)
WBC: 6 K/uL (ref 4.0–10.5)
nRBC: 0 % (ref 0.0–0.2)

## 2023-03-13 LAB — COMPREHENSIVE METABOLIC PANEL WITH GFR
ALT: 11 U/L (ref 0–44)
AST: 16 U/L (ref 15–41)
Albumin: 4.1 g/dL (ref 3.5–5.0)
Alkaline Phosphatase: 84 U/L (ref 38–126)
Anion gap: 8 (ref 5–15)
BUN: 11 mg/dL (ref 8–23)
CO2: 24 mmol/L (ref 22–32)
Calcium: 8.9 mg/dL (ref 8.9–10.3)
Chloride: 103 mmol/L (ref 98–111)
Creatinine, Ser: 0.6 mg/dL (ref 0.44–1.00)
GFR, Estimated: >60 mL/min (ref >60)
Glucose, Bld: 122 mg/dL — ABNORMAL HIGH (ref 70–99)
Potassium: 3.9 mmol/L (ref 3.5–5.1)
Sodium: 135 mmol/L (ref 135–145)
Total Bilirubin: 1.4 mg/dL — ABNORMAL HIGH (ref 0.0–1.2)
Total Protein: 7.9 g/dL (ref 6.5–8.1)

## 2023-03-13 LAB — URINALYSIS, ROUTINE W REFLEX MICROSCOPIC
Bilirubin Urine: NEGATIVE
Glucose, UA: NEGATIVE mg/dL
Hgb urine dipstick: NEGATIVE
Ketones, ur: 5 mg/dL — AB
Leukocytes,Ua: NEGATIVE
Nitrite: NEGATIVE
Protein, ur: NEGATIVE mg/dL
Specific Gravity, Urine: 1.011 (ref 1.005–1.030)
pH: 7 (ref 5.0–8.0)

## 2023-03-13 LAB — TROPONIN I (HIGH SENSITIVITY): Troponin I (High Sensitivity): 4 ng/L (ref <18)

## 2023-03-13 MED ORDER — LIDOCAINE 4 % EX PTCH: 1.0000 | MEDICATED_PATCH | CUTANEOUS | 0 refills | Status: DC

## 2023-03-13 NOTE — ED Triage Notes (Signed)
 Pt states that yesterday she woke up at 0230 having palpitations and being diaphoretic. During the day yesterday she began taking her BP frequently and was consistently hypertensive, but SBP values ranged 140-175. Pt also c/o L sided headache with "lightning bolts" down into her face. Denies vision changes, no unilateral weakness. Pt denies CP, SOB.

## 2023-03-13 NOTE — ED Provider Notes (Signed)
 [place] EMERGENCY DEPARTMENT AT [facility] Provider Note   CSN: [id number] Arrival date & time: 03/13/23  1112     History  Chief Complaint  Patient presents with   Palpitations   Hypertension    Kristina Sloan is a 78 y.o. female.  This is a 78 year old female presenting emergency department for palpitations.  Reports she had an episode of palpitations Monday, felt slightly sweaty during it.  No lightheadedness or chest pain.  She then noted that her blood pressure has seemingly been higher than normal.  She also complains of some left-sided shoulder pain and left-sided low back pain.  Symptoms been ongoing for the past 7 to 10 days.  No inciting factors.  No numbness tingling changes in sensation.   Palpitations Hypertension       Home Medications Prior to Admission medications   Medication Sig Start Date End Date Taking? Authorizing Provider  lidocaine (HM LIDOCAINE PATCH) 4 % Place 1 patch onto the skin daily. 03/13/23  Yes Rumor Sun, Harmon Dun, DO  ALPRAZolam Prudy Feeler) 1 MG tablet Take 0.5 mg by mouth 2 (two) times daily as needed for anxiety.    [provider]  Biotin 5000 MCG TABS Take 1 tablet by mouth daily.    [provider]  carvedilol (COREG) 12.5 MG tablet Take 1 tablet (12.5 mg total) by mouth 2 (two) times daily with a meal. 10/16/17   Vassie Loll, MD  Cholecalciferol (VITAMIN D3) 2000 units capsule Take 2,000 Units by mouth daily.    [provider]  Cyanocobalamin (VITAMIN B-12) 5000 MCG TBDP Take 1 tablet by mouth daily.    [provider]  diltiazem (DILACOR XR) 240 MG 24 hr capsule Take 240 mg by mouth daily.    [provider]  gabapentin (NEURONTIN) 600 MG tablet Take 600 mg by mouth once. 12/05/22 09/01/23  [provider]  pantoprazole (PROTONIX) 40 MG tablet Take 40 mg by mouth daily. 06/10/20   [provider]  diltiazem (CARDIZEM LA) 240 MG 24 hr tablet Take 1 tablet (240 mg total)  by mouth daily. 05/25/12 04/02/20  Rolan Lipa, NP      Allergies    Patient has no known allergies.    Review of Systems   Review of Systems  Cardiovascular:  Positive for palpitations.    Physical Exam Updated Vital Signs BP (!) 154/102 (BP Location: Left Arm)   Pulse 69   Temp 97.8 F (36.6 C) (Oral)   Resp 18   SpO2 96%  Physical Exam Vitals and nursing note reviewed.  Constitutional:      General: She is not in acute distress.    Appearance: She is not toxic-appearing.  HENT:     Nose: Nose normal.     Mouth/Throat:     Mouth: Mucous membranes are moist.  Eyes:     Conjunctiva/sclera: Conjunctivae normal.  Cardiovascular:     Rate and Rhythm: Normal rate and regular rhythm.  Pulmonary:     Effort: Pulmonary effort is normal.     Breath sounds: Normal breath sounds.  Abdominal:     General: Abdomen is flat. There is no distension.     Tenderness: There is no abdominal tenderness. There is no guarding or rebound.  Musculoskeletal:        General: Normal range of motion.  Skin:    General: Skin is warm and dry.     Capillary Refill: Capillary refill takes less than 2 seconds.  Neurological:     Mental Status: She is alert.  Psychiatric:        Mood and Affect: Mood normal.     ED Results / Procedures / Treatments   Labs (all labs ordered are listed, but only abnormal results are displayed) Labs Reviewed  COMPREHENSIVE METABOLIC PANEL - Abnormal; Notable for the following components:      Result Value   Glucose, Bld 122 (*)    Total Bilirubin 1.4 (*)    All other components within normal limits  URINALYSIS, ROUTINE W REFLEX MICROSCOPIC - Abnormal; Notable for the following components:   APPearance HAZY (*)    Ketones, ur 5 (*)    All other components within normal limits  CBC WITH DIFFERENTIAL/PLATELET  TROPONIN I (HIGH SENSITIVITY)  TROPONIN I (HIGH SENSITIVITY)    EKG EKG Interpretation Date/Time:  Tuesday March 13 2023 11:57:26  EST Ventricular Rate:  78 PR Interval:  160 QRS Duration:  80 QT Interval:  386 QTC Calculation: 440 R Axis:   47  Text Interpretation: Sinus rhythm Low voltage, precordial leads Confirmed by Estanislado Pandy [phone]) on 03/13/2023 2:31:30 PM  Radiology DG Chest 2 View Result Date: 03/13/2023 CLINICAL DATA:  Left-sided chest pain. EXAM: CHEST - 2 VIEW COMPARISON:  04/02/2020 and chest CT 03/05/2023 FINDINGS: Chronic densities at the right lung base. No new airspace disease or pulmonary edema. Heart size is normal. Chronic curvature in the thoracic spine. Surgical clips in the right breast. No acute bone abnormality. No large pleural effusions. IMPRESSION: 1. No acute cardiopulmonary disease. 2. Chronic densities at the right lung base. Electronically Signed   By: Richarda Overlie M.D.   On: 03/13/2023 16:03    Procedures Procedures    Medications Ordered in ED Medications - No data to display  ED Course/ Medical Decision Making/ A&P Clinical Course as of 03/13/23 1743  Tue Mar 13, 2023  1431 Per chart review normal EF in 2019 and normal nuclear medicine stress test. [TY]  1431 CBC with Differential No leukocytosis to suggest systemic infection.  No anemia. [TY]  1432 Troponin I (High Sensitivity): 4 [TY]  1432 Comprehensive metabolic panel(!) No significant metabolic derangements.  Minor elevation in glucose, does not appear to be in DKA. [TY]  1608 DG Chest 2 View IMPRESSION: 1. No acute cardiopulmonary disease. 2. Chronic densities at the right lung base.   [TY]    Clinical Course User Index [TY] Coral Spikes, DO                                 Medical Decision Making Is a 78 year old female presenting emergency department for palpitations and elevated blood pressure.  She is afebrile nontachycardic, was hypertensive 185/108, improved without intervention to 154/102.  EKG appears to be normal sinus rhythm.  Low suspicion for ACS.  Troponin negative.  Not having chest pain.  She  has no fever tachycardia or leukocytosis to suggest systemic infection.  No metabolic derangements.  Normal kidney function.  UA without evidence of urinary tract infection.  Chest x-ray without pneumonia pneumothorax on my independent interpretation.  Her shoulder pain seemingly MSK.  Treated with lidocaine patch and Tylenol.  Discussed follow-up with primary doctor for blood pressure control as she appears to have asymptomatic hypertension.  Stable for discharge at this time.  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 External Data Reviewed:     Details: See ED course Labs:  Decision-making details documented in ED Course. Radiology:  Decision-making details documented in ED Course.  Risk OTC drugs.         Final Clinical Impression(s) / ED Diagnoses Final diagnoses:  Palpitations    Rx / DC Orders ED Discharge Orders          Ordered    lidocaine (HM LIDOCAINE PATCH) 4 %  Every 24 hours        03/13/23 1648              Coral Spikes, DO 03/13/23 1743

## 2023-03-13 NOTE — Telephone Encounter (Signed)
 Copied from CRM [id number]. Topic: Clinical - Red Word Triage >> Mar 13, 2023  9:30 AM Kristina Sloan wrote: [facility] that prompted transfer to Nurse Triage: Blood pressue the highest 170/100 yesterday and this morning when she took it it was 170/94 the lowest its been was 125/81 that was 11am yesterday and is still rising . Pain on her left side - left shoulder and right at her waist   Chief Complaint: Blood Pressure-High Symptoms: Left Shoulder, Left Flank Pain, Headaches, Tingling Frequency: Acute Pertinent Negatives: Patient denies chest pain, dyspnea Disposition: [x] ED /[] Urgent Care (no appt availability in office) / [] Appointment(In office/virtual)/ []  [facility]/ [] Home Care/ [] Refused Recommended Disposition /[] [facility]/ []  Follow-up with PCP Additional Notes: Patient contacted via phone regarding concerns of elevated blood pressure readings. The patient also complains of pain on her left shoulder and left flank area, rates both of them as an 8 on a numeric pain scale. Heat provides some relief, but they have been constant since the start. The patient also reports a headache she describes as a stabbing like sensation. Discussed symptoms, severity, and duration. Based on assessment, patient was advised to seek emergency care. Patient verbalized understanding and agreement with plan. Documentation provided.     Reason for Disposition  [1] Systolic BP  >= 160 OR Diastolic >= 100 AND [2] cardiac (e.g., breathing difficulty, chest pain) or neurologic symptoms (e.g., new-onset blurred or double vision, unsteady gait)  Answer Assessment - Initial Assessment Questions 1. BLOOD PRESSURE: "What is the blood pressure?" "Did you take at least two measurements 5 minutes apart?"     158/105, 165/104, 170/100, Yesterday Morning.   170/94 This morning  2. ONSET: "When did you take your blood pressure?"     This morning  3. HOW: "How did you take your blood pressure?" (e.g.,  automatic home BP monitor, visiting nurse)     Automatic Blood Pressure Monitor  4. HISTORY: "Do you have a history of high blood pressure?"     Yes  5. MEDICINES: "Are you taking any medicines for blood pressure?" "Have you missed any doses recently?"     Yes, No missed doses  6. OTHER SYMPTOMS: "Do you have any symptoms?" (e.g., blurred vision, chest pain, difficulty breathing, headache, weakness)     Shoulder Pain, Flank Pain, Stabbing Headache  7. PREGNANCY: "Is there any chance you are pregnant?" "When was your last menstrual period?"     No and No  Protocols used: Blood Pressure - High-A-AH

## 2023-03-13 NOTE — Discharge Instructions (Addendum)
 As discussed please follow-up with your primary doctor and your cardiologist.  Return immediately if develop fevers, chills, sudden onset headache, vision changes, chest pain, shortness of breath, continued palpitations, or any new or worsening symptoms that are concerning to you.  You may take over-the-counter Tylenol for pain.  You may also apply the lidocaine patch for your shoulder pain.

## 2023-03-13 NOTE — ED Provider Triage Note (Signed)
 Emergency Medicine Provider Triage Evaluation Note  Kristina Sloan , a 78 y.o. female  was evaluated in triage.  Pt complains of palpitations, HTN. Patient states that she has been noticing BP increasing at home. She takes diltiazem and carvedilol. Woke up at 2:30am two days ago diaphoretic and with feelings of palpitations. No history of heart arrhythmia as far as she is aware. 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  Review of Systems  Positive: As above Negative: As above  Physical Exam  BP (!) 185/108 (BP Location: Left Arm)   Pulse 79   Temp 98.2 F (36.8 C) (Oral)   Resp 18   SpO2 98%  Gen:   Awake, no distress   Resp:  Normal effort  MSK:   Moves extremities without difficulty  Other:  No appreciable murmur, lungs are CTAB  Medical Decision Making  Medically screening exam initiated at 1:06 PM.  Appropriate orders placed.  AZAIAH MELLO was informed that the remainder of the evaluation will be completed by another provider, this initial triage assessment does not replace that evaluation, and the importance of remaining in the ED until their evaluation is complete.     Smitty Knudsen, PA-C 03/13/23 1309

## 2023-03-14 NOTE — Progress Notes (Unsigned)
      Established patient visit   Patient: Kristina Sloan   DOB: Jul 21, 1945   78 y.o. Female  MRN: [id number] Visit Date: 03/15/2023  Today's healthcare provider: Alfredia Ferguson, PA-C   No chief complaint on file.  Subjective     Pt was seen in the ED 03/13/23 for heart palpitations, left sided shoulder and back pain.  Glucose 122  Chronic densities right lung base otherwise normal  Htn 185/108, 154/102  EKG normal sinus   Heart monitor   Medications: Outpatient Medications Prior to Visit  Medication Sig   ALPRAZolam (XANAX) 1 MG tablet Take 0.5 mg by mouth 2 (two) times daily as needed for anxiety.   Biotin 5000 MCG TABS Take 1 tablet by mouth daily.   carvedilol (COREG) 12.5 MG tablet Take 1 tablet (12.5 mg total) by mouth 2 (two) times daily with a meal.   Cholecalciferol (VITAMIN D3) 2000 units capsule Take 2,000 Units by mouth daily.   Cyanocobalamin (VITAMIN B-12) 5000 MCG TBDP Take 1 tablet by mouth daily.   diltiazem (DILACOR XR) 240 MG 24 hr capsule Take 240 mg by mouth daily.   gabapentin (NEURONTIN) 600 MG tablet Take 600 mg by mouth once.   lidocaine (HM LIDOCAINE PATCH) 4 % Place 1 patch onto the skin daily.   pantoprazole (PROTONIX) 40 MG tablet Take 40 mg by mouth daily.   No facility-administered medications prior to visit.    Review of Systems {Insert previous labs (optional):23779} {See past labs  Heme  Chem  Endocrine  Serology  Results Review (optional):1}   Objective    There were no vitals taken for this visit. {Insert last BP/Wt (optional):23777}{See vitals history (optional):1}  Physical Exam  ***  No results found for any visits on 03/15/23.  Assessment & Plan    There are no diagnoses linked to this encounter.  ***  No follow-ups on file.       Alfredia Ferguson, PA-C  [facility] at [facility] [phone] (phone) [phone] (fax)  [facility]

## 2023-03-15 ENCOUNTER — Encounter: Payer: Self-pay | Admitting: Physician Assistant

## 2023-03-15 ENCOUNTER — Ambulatory Visit: Admitting: Physician Assistant

## 2023-03-15 VITALS — BP 126/78 | HR 69 | Ht 64.0 in | Wt 142.8 lb

## 2023-03-15 DIAGNOSIS — R002 Palpitations: Secondary | ICD-10-CM

## 2023-03-15 DIAGNOSIS — M25512 Pain in left shoulder: Secondary | ICD-10-CM | POA: Diagnosis not present

## 2023-03-15 LAB — TSH: TSH: 0.75 u[IU]/mL (ref 0.35–5.50)

## 2023-03-15 LAB — T4, FREE: Free T4: 1.17 ng/dL (ref 0.60–1.60)

## 2023-03-15 MED ORDER — TRAMADOL HCL 50 MG PO TABS: 50.0000 mg | ORAL_TABLET | Freq: Four times a day (QID) | ORAL | 0 refills | Status: DC | PRN

## 2023-03-17 ENCOUNTER — Encounter: Payer: Self-pay | Admitting: Physician Assistant

## 2023-03-20 ENCOUNTER — Encounter: Payer: Self-pay | Admitting: *Deleted

## 2023-03-26 ENCOUNTER — Encounter: Payer: Self-pay | Admitting: Physician Assistant

## 2023-03-27 ENCOUNTER — Other Ambulatory Visit: Payer: Self-pay | Admitting: *Deleted

## 2023-03-27 MED ORDER — ROSUVASTATIN CALCIUM 10 MG PO TABS
10.0000 mg | ORAL_TABLET | Freq: Every day | ORAL | 3 refills | Status: DC
Start: 2023-03-27 — End: 2023-04-17

## 2023-03-30 ENCOUNTER — Other Ambulatory Visit: Payer: Self-pay | Admitting: Physician Assistant

## 2023-03-30 DIAGNOSIS — Z1231 Encounter for screening mammogram for malignant neoplasm of breast: Secondary | ICD-10-CM

## 2023-04-02 ENCOUNTER — Other Ambulatory Visit: Payer: Self-pay | Admitting: Physician Assistant

## 2023-04-02 NOTE — Telephone Encounter (Signed)
 Copied from CRM [id number]. Topic: Clinical - Medication Refill >> Apr 02, 2023  9:58 AM Truddie Crumble wrote: Most Recent Primary Care Visit:  Provider: Alfredia Ferguson  Department: [facility]  Visit Type: HOSPITAL FOLLOW UP  Date: 03/15/2023  Medication: ALPRAZolam (XANAX) 1 MG tablet and gabapentin (NEURONTIN) 600 MG tablet  Has the patient contacted their pharmacy? Yes (Agent: If no, request that the patient contact the pharmacy for the refill. If patient does not wish to contact the pharmacy document the reason why and proceed with request.) (Agent: If yes, when and what did the pharmacy advise?)  Is this the correct pharmacy for this prescription? Yes If no, delete pharmacy and type the correct one.  This is the patient's preferred pharmacy:  [id number] - [place], [place] - [address] [place]  [id number] Phone: [phone] Fax: [phone]  Has the prescription been filled recently? Yes  Is the patient out of the medication? Yes  Has the patient been seen for an appointment in the last year OR does the patient have an upcoming appointment? Yes  Can we respond through MyChart? Yes  Agent: Please be advised that Rx refills may take up to 3 business days. We ask that you follow-up with your pharmacy.

## 2023-04-03 MED ORDER — ALPRAZOLAM 1 MG PO TABS: 0.5000 mg | ORAL_TABLET | Freq: Two times a day (BID) | ORAL | 0 refills | Status: DC | PRN

## 2023-04-03 MED ORDER — GABAPENTIN 600 MG PO TABS: 600.0000 mg | ORAL_TABLET | Freq: Every day | ORAL | 1 refills | Status: DC

## 2023-04-17 ENCOUNTER — Encounter: Payer: Self-pay | Admitting: Physician Assistant

## 2023-04-17 ENCOUNTER — Other Ambulatory Visit: Payer: Self-pay | Admitting: Physician Assistant

## 2023-04-17 DIAGNOSIS — E785 Hyperlipidemia, unspecified: Secondary | ICD-10-CM

## 2023-04-17 MED ORDER — PRAVASTATIN SODIUM 10 MG PO TABS: 10.0000 mg | ORAL_TABLET | Freq: Every day | ORAL | 1 refills | Status: DC

## 2023-04-24 ENCOUNTER — Ambulatory Visit

## 2023-04-30 ENCOUNTER — Ambulatory Visit

## 2023-05-24 ENCOUNTER — Other Ambulatory Visit: Payer: Self-pay | Admitting: Physician Assistant

## 2023-05-24 NOTE — Telephone Encounter (Signed)
 Requesting: alprazolam  1mg   Contract: None UDS: None Last Visit: 03/15/23 Next Visit: 08/20/23 Last Refill: 04/03/23 #30 and 0RF  Please Advise

## 2023-06-13 ENCOUNTER — Ambulatory Visit

## 2023-06-13 ENCOUNTER — Ambulatory Visit
Admission: RE | Admit: 2023-06-13 | Discharge: 2023-06-13 | Disposition: A | Discharge status: Home / Self Care | Source: Ambulatory Visit | Attending: Physician Assistant | Admitting: Physician Assistant

## 2023-06-13 DIAGNOSIS — Z1231 Encounter for screening mammogram for malignant neoplasm of breast: Secondary | ICD-10-CM

## 2023-06-14 ENCOUNTER — Encounter (INDEPENDENT_AMBULATORY_CARE_PROVIDER_SITE_OTHER): Payer: Self-pay | Admitting: *Deleted

## 2023-06-20 ENCOUNTER — Ambulatory Visit: Payer: Self-pay | Admitting: Physician Assistant

## 2023-06-20 ENCOUNTER — Ambulatory Visit (INDEPENDENT_AMBULATORY_CARE_PROVIDER_SITE_OTHER)

## 2023-06-20 VITALS — BP 126/78 | Ht 64.0 in | Wt 139.5 lb

## 2023-06-20 DIAGNOSIS — Z2821 Immunization not carried out because of patient refusal: Secondary | ICD-10-CM | POA: Diagnosis not present

## 2023-06-20 DIAGNOSIS — Z0000 Encounter for general adult medical examination without abnormal findings: Secondary | ICD-10-CM

## 2023-06-20 DIAGNOSIS — Z5320 Procedure and treatment not carried out because of patient's decision for unspecified reasons: Secondary | ICD-10-CM

## 2023-06-20 NOTE — Progress Notes (Signed)
 Because this visit was a virtual/telehealth visit,  certain criteria was not obtained, such a blood pressure, CBG if applicable, and timed get up and go. Any medications not marked as taking were not mentioned during the medication reconciliation part of the visit. Any vitals not documented were not able to be obtained due to this being a telehealth visit or patient was unable to self-report a recent blood pressure reading due to a lack of equipment at home via telehealth. Vitals that have been documented are verbally provided by the patient.   This visit was performed by a medical professional under my direct supervision. I was immediately available for consultation/collaboration. I have reviewed and agree with the Annual Wellness Visit documentation.  Subjective:   Kristina Sloan is a 78 y.o. who presents for a Medicare Wellness preventive visit.  As a reminder, Annual Wellness Visits don't include a physical exam, and some assessments may be limited, especially if this visit is performed virtually. We may recommend an in-person follow-up visit with your provider if needed.  Visit Complete: Virtual I connected with  Kristina Sloan on 06/20/23 by a audio enabled telemedicine application and verified that I am speaking with the correct person using two identifiers.  Patient Location: Home  Provider Location: Home Office  I discussed the limitations of evaluation and management by telemedicine. The patient expressed understanding and agreed to proceed.  Vital Signs: Because this visit was a virtual/telehealth visit, some criteria may be missing or patient reported. Any vitals not documented were not able to be obtained and vitals that have been documented are patient reported.  VideoDeclined- This patient declined Librarian, academic. Therefore the visit was completed with audio only.  Persons Participating in Visit: Patient.  AWV Questionnaire: Yes: Patient Medicare  AWV questionnaire was completed by the patient on 06/15/2023; I have confirmed that all information answered by patient is correct and no changes since this date.  Cardiac Risk Factors include: advanced age (>74men, >50 women);dyslipidemia;hypertension     Objective:     Today's Vitals   06/20/23 1101  BP: 126/78  Weight: 139 lb 8 oz (63.3 kg)  Height: 5' 4 (1.626 m)   Body mass index is 23.95 kg/m.     03/13/2023   11:52 AM 06/29/2021   11:12 AM 04/02/2020   11:30 AM 10/15/2017    3:18 PM 10/15/2017   12:25 PM 03/28/2016    9:42 AM 07/29/2013    8:09 AM  Advanced Directives  Does Patient Have a Medical Advance Directive? Yes Yes Yes No Yes Yes Patient does not have advance directive;Patient would not like information  Type of Public librarian Power of [place];Living will Living will Living will Healthcare Power of [employer] Power of [place];Living will Healthcare Power of [place];Living will   Does patient want to make changes to medical advance directive?   No - Guardian declined No - Patient declined No - Patient declined No - Patient declined   Copy of Healthcare Power of Attorney in Chart?    No - copy requested No - copy requested No - copy requested   Would patient like information on creating a medical advance directive?    No - Patient declined       Current Medications (verified) Outpatient Encounter Medications as of 06/20/2023  Medication Sig   ALPRAZolam  (XANAX ) 1 MG tablet TAKE 0.5 TABLETS (0.5 MG TOTAL) BY MOUTH 2 (TWO) TIMES DAILY AS NEEDED FOR ANXIETY.   Biotin 5000 MCG  TABS Take 1 tablet by mouth daily.   carvedilol  (COREG ) 12.5 MG tablet Take 1 tablet (12.5 mg total) by mouth 2 (two) times daily with a meal.   Cholecalciferol  (VITAMIN D3) 2000 units capsule Take 2,000 Units by mouth daily.   Cyanocobalamin  (VITAMIN B-12) 5000 MCG TBDP Take 1 tablet by mouth daily.   diltiazem  (DILACOR XR ) 240 MG 24 hr capsule Take 240 mg by mouth daily.    gabapentin  (NEURONTIN ) 600 MG tablet Take 1 tablet (600 mg total) by mouth at bedtime.   lidocaine  (HM LIDOCAINE  PATCH) 4 % Place 1 patch onto the skin daily.   pantoprazole (PROTONIX) 40 MG tablet Take 40 mg by mouth daily.   pravastatin  (PRAVACHOL ) 10 MG tablet Take 1 tablet (10 mg total) by mouth daily.   traMADol  (ULTRAM ) 50 MG tablet Take 1 tablet (50 mg total) by mouth every 6 (six) hours as needed.   No facility-administered encounter medications on file as of 06/20/2023.    Allergies (verified) Rosuvastatin    History: Past Medical History:  Diagnosis Date   Anxiety 1970's   Arthritis ?   Breast cancer (HCC) 2003   Breast cyst    Cancer (HCC) 2003   rt breast 2003 lumpectomy/rad tx   Chest pain 05/23/2012   Complication of anesthesia    DIFFICULT WAKING ONLY ONCE   Hard of hearing    Hypertension    Scoliosis    Past Surgical History:  Procedure Laterality Date   BREAST LUMPECTOMY Right 12/2001   COCHLEAR IMPLANT Left 2018   COLONOSCOPY N/A 07/29/2013   Procedure: COLONOSCOPY;  Surgeon: Suzette Espy, MD;  Location: [facility];  Service: Endoscopy;  Laterality: N/A;  2:00 PM-moved to 915 Pt notified of time change   EYE SURGERY  2014   Strabysmis   Left [place] Procedure  1994   STRABISMUS SURGERY Right    TUBAL LIGATION     Family History  Problem Relation Age of Onset   COPD Mother    Hypertension Mother    Bladder Cancer Father    Bladder Cancer Brother    Cancer Brother    Breast cancer Neg Hx    Social History   Socioeconomic History   Marital status: Married    Spouse name: Not on file   Number of children: 0   Years of education: Not on file   Highest education level: Associate degree: occupational, Scientist, product/process development, or vocational program  Occupational History   Occupation: retired  Tobacco Use   Smoking status: Former    Current packs/day: 0.00    Average packs/day: 1 pack/day for 25.0 years (25.0 ttl pk-yrs)    Types: Cigarettes    Start  date: 01/09/1965    Quit date: 01/09/1990    Years since quitting: 33.4   Smokeless tobacco: Never  Vaping Use   Vaping status: Never Used  Substance and Sexual Activity   Alcohol use: Yes    Alcohol/week: 7.0 standard drinks of alcohol    Types: 7 Glasses of wine per week    Comment: q pm, white wine   Drug use: No   Sexual activity: Not Currently    Birth control/protection: None    Comment: I'm 78 years old  Other Topics Concern   Not on file  Social History Narrative   [facility] (03/15/16):   Originally from [place]. She has lived in [place] since 1951. She grew up with her mother. She has traveled to [place] (in June), [place],  [place], [place], [place], [place], & multiple [place]  states. Previously worked in a Field seismologist. Currently has a dog. No bird or mold exposure. No recent hot tub exposure. Enjoys photography.    Social Drivers of Corporate investment banker Strain: Low Risk  (06/20/2023)   Overall Financial Resource Strain (CARDIA)    Difficulty of Paying Living Expenses: Not very hard  Food Insecurity: No Food Insecurity (06/15/2023)   Hunger Vital Sign    Worried About Running Out of Food in the Last Year: Never true    Ran Out of Food in the Last Year: Never true  Transportation Needs: No Transportation Needs (06/15/2023)   PRAPARE - Administrator, Civil Service (Medical): No    Lack of Transportation (Non-Medical): No  Physical Activity: Insufficiently Active (06/15/2023)   Exercise Vital Sign    Days of Exercise per Week: 3 days    Minutes of Exercise per Session: 30 min  Stress: No Stress Concern Present (06/15/2023)   [employer] of Occupational Health - Occupational Stress Questionnaire    Feeling of Stress : Only a little  Social Connections: Moderately Isolated (06/15/2023)   Social Connection and Isolation Panel [NHANES]    Frequency of Communication with Friends and Family: More than three times a week    Frequency of Social Gatherings  with Friends and Family: Once a week    Attends Religious Services: Never    Database administrator or Organizations: No    Attends Engineer, structural: Never    Marital Status: Married    Tobacco Counseling Counseling given: Not Answered    Clinical Intake:  Pre-visit preparation completed: Yes  Pain : No/denies pain     BMI - recorded: 23.95 Nutritional Status: BMI of 19-24  Normal Nutritional Risks: None Diabetes: No  Lab Results  Component Value Date   HGBA1C 6.1 02/20/2023   HGBA1C 5.6 10/15/2017   HGBA1C 5.3 05/24/2012     How often do you need to have someone help you when you read instructions, pamphlets, or other written materials from your doctor or pharmacy?: 1 - Never What is the last grade level you completed in school?: college degree     Information entered by :: [employer]   Activities of Daily Living     06/15/2023    8:57 AM  In your present state of health, do you have any difficulty performing the following activities:  Hearing? 1  Vision? 0  Difficulty concentrating or making decisions? 1  Walking or climbing stairs? 0  Dressing or bathing? 0  Doing errands, shopping? 0  Preparing Food and eating ? N  Using the Toilet? N  In the past six months, have you accidently leaked urine? Y  Do you have problems with loss of bowel control? N  Managing your Medications? N  Managing your Finances? N  Housekeeping or managing your Housekeeping? N    Patient Care Team: Trenton Frock, PA-C as PCP - General (Physician Assistant) Loyde Rule, MD as PCP - Cardiology (Cardiology)  I have updated your Care Teams any recent Medical Services you may have received from other providers in the past year.     Assessment:    This is a routine wellness examination for Kristina Sloan.  Hearing/Vision screen Hearing Screening - Comments:: Patient wears hearing aids  Vision Screening - Comments:: Wears glasses   Goals Addressed              This  Visit's Progress    Patient Stated       Patient would like her husband to get well       Depression Screen     06/20/2023   11:05 AM 02/20/2023   10:23 AM  PHQ 2/9 Scores  PHQ - 2 Score 0 0  PHQ- 9 Score 0     Fall Risk     06/15/2023    8:57 AM 02/20/2023   10:23 AM  Fall Risk   Falls in the past year? 0 0  Number falls in past yr: 0 0  Injury with Fall? 0 0  Risk for fall due to : No Fall Risks No Fall Risks  Follow up Falls evaluation completed Falls evaluation completed    MEDICARE RISK AT HOME:  Medicare Risk at Home Any stairs in or around the home?: (Patient-Rptd) Yes If so, are there any without handrails?: (Patient-Rptd) No Home free of loose throw rugs in walkways, pet beds, electrical cords, etc?: (Patient-Rptd) No Adequate lighting in your home to reduce risk of falls?: (Patient-Rptd) Yes Life alert?: (Patient-Rptd) No Use of a cane, walker or w/c?: (Patient-Rptd) No Grab bars in the bathroom?: (Patient-Rptd) Yes Shower chair or bench in shower?: (Patient-Rptd) Yes Elevated toilet seat or a handicapped toilet?: (Patient-Rptd) Yes  TIMED UP AND GO:  Was the test performed?  No  Cognitive Function: 6CIT completed        06/20/2023   11:03 AM  6CIT Screen  What Year? 0 points  What month? 0 points  What time? 0 points  Count back from 20 0 points  Months in reverse 0 points  Repeat phrase 0 points  Total Score 0 points    Immunizations Immunization History  Administered Date(s) Administered   Hepatitis A, Ped/Adol-2 Dose 04/22/2015, 08/28/2019   Hepatitis B, PED/ADOLESCENT 12/08/2015, 01/05/2016, 08/28/2019   Influenza, High Dose Seasonal PF 11/13/2016   Influenza-Unspecified 10/08/2014, 10/09/2017, 10/13/2020, 11/01/2021, 10/27/2022   MMR 02/01/2016   PNEUMOCOCCAL CONJUGATE-20 02/20/2023   Pfizer(Comirnaty)Fall Seasonal Vaccine 12 years and older 02/03/2019, 02/24/2019, 10/25/2019, 05/28/2020, 11/15/2021   RSV,unspecified  12/07/2021   Td 11/13/2011, 02/21/2016   Tdap 11/13/2011, 02/21/2016   Zoster, Live 03/24/2019, 06/12/2019    Screening Tests Health Maintenance  Topic Date Due   Hepatitis C Screening  Never done   Zoster Vaccines- Shingrix (1 of 2) 11/03/1997   COVID-19 Vaccine (6 - 2024-25 season) 09/10/2022   INFLUENZA VACCINE  08/10/2023   MAMMOGRAM  06/12/2024   Medicare Annual Wellness (AWV)  06/19/2024   DTaP/Tdap/Td (5 - Td or Tdap) 02/20/2026   Colonoscopy  06/22/2030   Pneumonia Vaccine 35+ Years old  Completed   DEXA SCAN  Completed   HPV VACCINES  Aged Out   Meningococcal B Vaccine  Aged Out    Health Maintenance  Health Maintenance Due  Topic Date Due   Hepatitis C Screening  Never done   Zoster Vaccines- Shingrix (1 of 2) 11/03/1997   COVID-19 Vaccine (6 - 2024-25 season) 09/10/2022   Health Maintenance Items Addressed: Patient declined vaccines   Additional Screening:  Vision Screening: Recommended annual ophthalmology exams for early detection of glaucoma and other disorders of the eye. Would you like a referral to an eye doctor? No    Dental Screening: Recommended annual dental exams for proper oral hygiene  Community Resource Referral / Chronic Care Management: CRR required this visit?  No   CCM required this visit?  No   Plan:    I  have personally reviewed and noted the following in the patient's chart:   Medical and social history Use of alcohol, tobacco or illicit drugs  Current medications and supplements including opioid prescriptions. Patient is not currently taking opioid prescriptions. Functional ability and status Nutritional status Physical activity Advanced directives List of other physicians Hospitalizations, surgeries, and ER visits in previous 12 months Vitals Screenings to include cognitive, depression, and falls Referrals and appointments  In addition, I have reviewed and discussed with patient certain preventive protocols, quality  metrics, and best practice recommendations. A written personalized care plan for preventive services as well as general preventive health recommendations were provided to patient.   Kristina Sloan, [place]   06/20/2023   After Visit Summary: (MyChart) Due to this being a telephonic visit, the after visit summary with patients personalized plan was offered to patient via MyChart   Notes: Nothing significant to report at this time.

## 2023-06-20 NOTE — Patient Instructions (Signed)
 Kristina Sloan , Thank you for taking time out of your busy schedule to complete your Annual Wellness Visit with me. I enjoyed our conversation and look forward to speaking with you again next year. I, as well as your care team,  appreciate your ongoing commitment to your health goals. Please review the following plan we discussed and let me know if I can assist you in the future. Your Game plan/ To Do List    Referrals: If you haven't heard from the office you've been referred to, please reach out to them at the phone provided.  none Follow up Visits: Next Medicare AWV with our clinical staff: 06/25/2024 Have you seen your provider in the last 6 months (3 months if uncontrolled diabetes)? No Next Office Visit with your provider: 08/20/2023  Clinician Recommendations:  Aim for 30 minutes of exercise or brisk walking, 6-8 glasses of water , and 5 servings of fruits and vegetables each day.       This is a list of the screening recommended for you and due dates:  Health Maintenance  Topic Date Due   Hepatitis C Screening  Never done   Zoster (Shingles) Vaccine (1 of 2) 11/03/1997   COVID-19 Vaccine (6 - 2024-25 season) 09/10/2022   Flu Shot  08/10/2023   Mammogram  06/12/2024   Medicare Annual Wellness Visit  06/19/2024   DTaP/Tdap/Td vaccine (5 - Td or Tdap) 02/20/2026   Colon Cancer Screening  06/22/2030   Pneumonia Vaccine  Completed   DEXA scan (bone density measurement)  Completed   HPV Vaccine  Aged Out   Meningitis B Vaccine  Aged Out    Advanced directives: (In Chart) A copy of your advanced directives are scanned into your chart should your provider ever need it. Advance Care Planning is important because it:  [x]  Makes sure you receive the medical care that is consistent with your values, goals, and preferences  [x]  It provides guidance to your family and loved ones and reduces their decisional burden about whether or not they are making the right decisions based on your  wishes.  Follow the link provided in your after visit summary or read over the paperwork we have mailed to you to help you started getting your Advance Directives in place. If you need assistance in completing these, please reach out to us  so that we can help you!  See attachments for Preventive Care and Fall Prevention Tips.

## 2023-07-19 ENCOUNTER — Other Ambulatory Visit: Payer: Self-pay | Admitting: *Deleted

## 2023-07-24 ENCOUNTER — Encounter: Payer: Self-pay | Admitting: Physician Assistant

## 2023-07-24 ENCOUNTER — Other Ambulatory Visit: Payer: Self-pay | Admitting: Physician Assistant

## 2023-07-24 MED ORDER — DILTIAZEM HCL ER 240 MG PO CP24: 240.0000 mg | ORAL_CAPSULE | Freq: Every day | ORAL | 1 refills | Status: DC

## 2023-07-24 NOTE — Telephone Encounter (Signed)
 Duplicate, previously routedThis encounter was created in error - please disregard.

## 2023-07-24 NOTE — Telephone Encounter (Signed)
 Copied from CRM [id number]. Topic: Clinical - Medication Refill >> Jul 24, 2023  8:59 AM Geroldine GRADE wrote: Medication: diltiazem  (DILACOR XR ) 240 MG 24 hr capsule   Has the patient contacted their pharmacy? Yes (Agent: If no, request that the patient contact the pharmacy for the refill. If patient does not wish to contact the pharmacy document the reason why and proceed with request.) (Agent: If yes, when and what did the pharmacy advise?)  This is the patient's preferred pharmacy:  [id number] [place], [place] - [address] [facility] [place] [address] Phone: [phone] Fax: [phone]   Is this the correct pharmacy for this prescription? Yes If no, delete pharmacy and type the correct one.   Has the prescription been filled recently? No  Is the patient out of the medication? No, will last for 3 days   Has the patient been seen for an appointment in the last year OR does the patient have an upcoming appointment? Yes  Can we respond through MyChart? Yes  Agent: Please be advised that Rx refills may take up to 3 business days. We ask that you follow-up with your pharmacy.

## 2023-07-24 NOTE — Telephone Encounter (Signed)
 Copied from CRM [id number]. Topic: Clinical - Medication Refill >> Jul 24, 2023  8:59 AM Kristina Sloan wrote: Medication: diltiazem  (DILACOR XR ) 240 MG 24 hr capsule   Has the patient contacted their pharmacy? Yes (Agent: If no, request that the patient contact the pharmacy for the refill. If patient does not wish to contact the pharmacy document the reason why and proceed with request.) (Agent: If yes, when and what did the pharmacy advise?)  This is the patient's preferred pharmacy:  [id number] - [place], [place] - [address] [facility] [place] [address] Phone: [phone] Fax: [phone]   Is this the correct pharmacy for this prescription? Yes If no, delete pharmacy and type the correct one.   Has the prescription been filled recently? No  Is the patient out of the medication? No, will last for 3 days   Has the patient been seen for an appointment in the last year OR does the patient have an upcoming appointment? Yes  Can we respond through MyChart? Yes  Agent: Please be advised that Rx refills may take up to 3 business days. We ask that you follow-up with your pharmacy.

## 2023-08-02 ENCOUNTER — Other Ambulatory Visit: Payer: Self-pay | Admitting: Physician Assistant

## 2023-08-02 DIAGNOSIS — F411 Generalized anxiety disorder: Secondary | ICD-10-CM

## 2023-08-02 MED ORDER — ALPRAZOLAM 1 MG PO TABS: 0.5000 mg | ORAL_TABLET | Freq: Two times a day (BID) | ORAL | 2 refills | Status: DC | PRN

## 2023-08-02 NOTE — Telephone Encounter (Signed)
 Per patient, additional information needed at pharmacy Last OV 03/15/2023

## 2023-08-02 NOTE — Telephone Encounter (Signed)
 refilled

## 2023-08-02 NOTE — Telephone Encounter (Signed)
 Requesting: alprazolam  1mg   Contract: None LID:[id number] Last Visit: 03/15/23 Next Visit: 08/20/23 Last Refill: 05/24/23 #30 and 2rf   Please Advise

## 2023-08-02 NOTE — Telephone Encounter (Unsigned)
 Copied from CRM [id number]. Topic: Clinical - Medication Refill >> Aug 02, 2023 11:39 AM Tanazia G wrote: Medication: ALPRAZolam  (XANAX ) 1 MG tablet  Pharmacy states that the rx was unclear or missing information and they were going to contact [facility] for clarification but it may take sometime. Patient is requesting [facility] nurse can reach out to the pharmacy on file to confirm this.  Has the patient contacted their pharmacy? Yes (Agent: If no, request that the patient contact the pharmacy for the refill. If patient does not wish to contact the pharmacy document the reason why and proceed with request.) (Agent: If yes, when and what did the pharmacy advise?)  This is the patient's preferred pharmacy:  [id number] - [place], [place] - [address] [facility] [place] [address] Phone: [phone] Fax: [phone]  Is this the correct pharmacy for this prescription? Yes If no, delete pharmacy and type the correct one.   Has the prescription been filled recently? Yes  Is the patient out of the medication? Yes  Has the patient been seen for an appointment in the last year OR does the patient have an upcoming appointment? Yes  Can we respond through MyChart? Yes  Agent: Please be advised that Rx refills may take up to 3 business days. We ask that you follow-up with your pharmacy.

## 2023-08-02 NOTE — Telephone Encounter (Signed)
 Spoke with pharmacy.  Pt DOES have one refill left however, [facility] and [facility] need a new rx with the ICD-10 code on the actual rx.  A new one will need to be sent in and it will cancel out the refill that she has remaining on the old rx.

## 2023-08-06 ENCOUNTER — Other Ambulatory Visit: Payer: Self-pay | Admitting: Physician Assistant

## 2023-08-06 DIAGNOSIS — M25512 Pain in left shoulder: Secondary | ICD-10-CM

## 2023-08-06 NOTE — Telephone Encounter (Signed)
 Requesting: tramadol  50mg   Contract: None UDS: None Last Visit: 03/15/23 Next Visit: 08/20/23 Last Refill: 03/15/23 #30 and 0RF   Please Advise

## 2023-08-20 ENCOUNTER — Ambulatory Visit: Payer: Medicare Other | Admitting: Physician Assistant

## 2023-08-20 ENCOUNTER — Encounter: Payer: Self-pay | Admitting: Physician Assistant

## 2023-08-20 VITALS — BP 115/75 | HR 60 | Ht 64.0 in | Wt 134.0 lb

## 2023-08-20 DIAGNOSIS — S22060S Wedge compression fracture of T7-T8 vertebra, sequela: Secondary | ICD-10-CM | POA: Diagnosis not present

## 2023-08-20 DIAGNOSIS — I10 Essential (primary) hypertension: Secondary | ICD-10-CM | POA: Diagnosis not present

## 2023-08-20 DIAGNOSIS — R7303 Prediabetes: Secondary | ICD-10-CM | POA: Insufficient documentation

## 2023-08-20 DIAGNOSIS — M546 Pain in thoracic spine: Secondary | ICD-10-CM | POA: Diagnosis not present

## 2023-08-20 DIAGNOSIS — R4789 Other speech disturbances: Secondary | ICD-10-CM

## 2023-08-20 DIAGNOSIS — G8929 Other chronic pain: Secondary | ICD-10-CM

## 2023-08-20 LAB — BASIC METABOLIC PANEL WITH GFR
BUN: 8 mg/dL (ref 6–23)
CO2: 29 meq/L (ref 19–32)
Calcium: 9 mg/dL (ref 8.4–10.5)
Chloride: 99 meq/L (ref 96–112)
Creatinine, Ser: 0.58 mg/dL (ref 0.40–1.20)
GFR: 88.29 mL/min (ref >60.00)
Glucose, Bld: 115 mg/dL — ABNORMAL HIGH (ref 70–99)
Potassium: 4.1 meq/L (ref 3.5–5.1)
Sodium: 133 meq/L — ABNORMAL LOW (ref 135–145)

## 2023-08-20 LAB — HEMOGLOBIN A1C: Hgb A1c MFr Bld: 5.9 % (ref 4.6–6.5)

## 2023-08-20 NOTE — Assessment & Plan Note (Signed)
 Elevated today in office but well controlled at home Manages with coreg  12.5 mg, dilt 240 mg

## 2023-08-20 NOTE — Assessment & Plan Note (Signed)
 Given weight loss and diet changes, likely improved. Repeat a1c

## 2023-08-20 NOTE — Progress Notes (Signed)
 Established patient visit   Patient: Kristina Sloan   DOB: 1945/05/03   78 y.o. Female  MRN: [id number] Visit Date: 08/20/2023  Today's healthcare provider: Manuelita Flatness, PA-C   Cc. Chronic care f/u  Subjective     Hypertension, follow-up  BP Readings from Last 3 Encounters:  08/20/23 115/75  06/20/23 126/78  03/15/23 126/78   Wt Readings from Last 3 Encounters:  08/20/23 134 lb (60.8 kg)  06/20/23 139 lb 8 oz (63.3 kg)  03/15/23 142 lb 12.8 oz (64.8 kg)      Outside blood pressures are 115/75 this am.   Pertinent labs Lab Results  Component Value Date   CHOL 187 02/20/2023   HDL 63.00 02/20/2023   LDLCALC 107 (H) 02/20/2023   TRIG 89.0 02/20/2023   CHOLHDL 3 02/20/2023   Lab Results  Component Value Date   NA 135 03/13/2023   K 3.9 03/13/2023   CREATININE 0.60 03/13/2023   GFRNONAA >60 03/13/2023   GLUCOSE 122 (H) 03/13/2023   TSH 0.75 03/15/2023     The 10-year ASCVD risk score (Arnett DK, et al., 2019) is: 17.3%  ---------------------------------------------------------------------------------------------------  Anxiety, Follow-up   GAD-7 Results    08/20/2023   10:15 AM  GAD-7 Generalized Anxiety Disorder Screening Tool  1. Feeling Nervous, Anxious, or on Edge 0  2. Not Being Able to Stop or Control Worrying 0  3. Worrying Too Much About Different Things 0  4. Trouble Relaxing 0  5. Being So Restless it's Hard To Sit Still 0  6. Becoming Easily Annoyed or Irritable 0  7. Feeling Afraid As If Something Awful Might Happen 0  Total GAD-7 Score 0  Difficulty At Work, Home, or Getting  Along With Others? Not difficult at all    PHQ-9 Scores    08/20/2023   10:15 AM 06/20/2023   11:05 AM 02/20/2023   10:23 AM  PHQ9 SCORE ONLY  PHQ-9 Total Score 3 0 0    --------------------------------------------------------------------------------------------------- Discussed the use of AI scribe software for clinical note transcription with the  patient, who gave verbal consent to proceed.  History of Present Illness   Kristina Sloan is a 78 year old female who presents with chronic back pain.  She experiences a constant ache in the muscles on the right side under the shoulder blade, described as a muscle spasm that 'grabs'. The pain does not respond to tramadol , Tylenol , or ibuprofen. She uses a heating pad regularly, including while sleeping. There is a history of an accident affecting her shoulder blade, and the pain sometimes begins with a tingling sensation below the shoulders. The pain limits her ability to perform activities she enjoys, such as mowing the yard.  Injections from a back specialist did not provide relief. Gabapentin  was effective for six months but then stopped working.  She has lost 15 pounds over the past three months, attributed to dietary changes made for her husband's cardiovascular health needs.   She experiences pressure headaches and occasional 'lightning' strikes of pain on the left side of her face. 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 She also wakes up with brain fog.  She reports continued issues with her memory, but despite this is planning to go on a cruise starting in [place], which she is excited for.     Medications: Outpatient Medications Prior to Visit  Medication Sig  ALPRAZolam  (XANAX ) 1 MG tablet Take 0.5 tablets (0.5 mg total) by mouth 2 (two) times daily as needed for anxiety.   Biotin 5000 MCG TABS Take 1 tablet by mouth daily.   carvedilol  (COREG ) 12.5 MG tablet Take 1 tablet (12.5 mg total) by mouth 2 (two) times daily with a meal.   celecoxib (CELEBREX) 200 MG capsule Take 200 mg by mouth daily.   Cholecalciferol  (VITAMIN D3) 2000 units capsule Take 2,000 Units by mouth daily.   Cyanocobalamin  (VITAMIN B-12) 5000 MCG TBDP Take 1 tablet by mouth daily.   diltiazem  (DILACOR XR ) 240 MG 24 hr capsule Take 1  capsule (240 mg total) by mouth daily.   lidocaine  (HM LIDOCAINE  PATCH) 4 % Place 1 patch onto the skin daily.   pantoprazole (PROTONIX) 40 MG tablet Take 40 mg by mouth daily.   pravastatin  (PRAVACHOL ) 10 MG tablet Take 1 tablet (10 mg total) by mouth daily.   traMADol  (ULTRAM ) 50 MG tablet TAKE 1 TABLET BY MOUTH EVERY 6 HOURS AS NEEDED.   [DISCONTINUED] gabapentin  (NEURONTIN ) 600 MG tablet Take 1 tablet (600 mg total) by mouth at bedtime.   No facility-administered medications prior to visit.    Review of Systems  Constitutional:  Negative for fatigue and fever.  Respiratory:  Negative for cough and shortness of breath.   Cardiovascular:  Negative for chest pain and leg swelling.  Gastrointestinal:  Negative for abdominal pain.  Musculoskeletal:  Positive for back pain.  Neurological:  Positive for headaches. Negative for dizziness.       Objective    BP 115/75 Comment: home values  Pulse 60   Ht 5' 4 (1.626 m)   Wt 134 lb (60.8 kg)   SpO2 96%   BMI 23.00 kg/m    Physical Exam Constitutional:      General: She is awake.     Appearance: She is well-developed.  HENT:     Head: Normocephalic.  Eyes:     Conjunctiva/sclera: Conjunctivae normal.  Cardiovascular:     Rate and Rhythm: Normal rate and regular rhythm.     Heart sounds: Normal heart sounds.  Pulmonary:     Effort: Pulmonary effort is normal.  Musculoskeletal:     Comments: Nontender to right thoracic spine  Skin:    General: Skin is warm.  Neurological:     Mental Status: She is alert and oriented to person, place, and time.  Psychiatric:        Attention and Perception: Attention normal.        Mood and Affect: Mood normal.        Speech: Speech normal.        Behavior: Behavior is cooperative.      No results found for any visits on 08/20/23.  Assessment & Plan    Primary hypertension Assessment & Plan: Elevated today in office but well controlled at home Manages with coreg  12.5 mg, dilt 240  mg     Prediabetes Assessment & Plan: Given weight loss and diet changes, likely improved. Repeat a1c  Orders: -     Hemoglobin A1c -     Basic metabolic panel with GFR  Chronic right-sided thoracic back pain Closed wedge compression fracture of T8 vertebra, sequela Recommending flexeril, pt has rx at home, tid prn but watch for drowsiness.  Referring to PT  -     Ambulatory referral to Physical Therapy  Word finding difficulty Assessment & Plan: Recommend neuro eval, no time for mse/moca today but  clinically may have progressed. More difficult to keep patient on track.    Orders: -     Ambulatory referral to Neurology    Return in about 1 month (around 09/20/2023) for chronic conditions, w/ new provider.       Manuelita Flatness, PA-C  [facility] [phone] (phone) [phone] (fax)  [facility]

## 2023-08-20 NOTE — Assessment & Plan Note (Signed)
 Recommend neuro eval, no time for mse/moca today but clinically may have progressed. More difficult to keep patient on track.

## 2023-08-21 ENCOUNTER — Ambulatory Visit: Payer: Self-pay | Admitting: Physician Assistant

## 2023-08-21 ENCOUNTER — Encounter: Payer: Self-pay | Admitting: Physician Assistant

## 2023-08-22 ENCOUNTER — Other Ambulatory Visit: Payer: Self-pay | Admitting: Physician Assistant

## 2023-08-22 DIAGNOSIS — G8929 Other chronic pain: Secondary | ICD-10-CM

## 2023-08-22 MED ORDER — GABAPENTIN 300 MG PO CAPS: 300.0000 mg | ORAL_CAPSULE | Freq: Three times a day (TID) | ORAL | 1 refills | Status: DC | PRN

## 2023-08-23 ENCOUNTER — Encounter: Payer: Self-pay | Admitting: Physician Assistant

## 2023-09-17 ENCOUNTER — Ambulatory Visit: Admitting: Family Medicine

## 2023-09-17 ENCOUNTER — Encounter: Payer: Self-pay | Admitting: Family Medicine

## 2023-09-17 VITALS — BP 130/69 | HR 61 | Ht 64.0 in | Wt 134.0 lb

## 2023-09-17 DIAGNOSIS — Z0000 Encounter for general adult medical examination without abnormal findings: Secondary | ICD-10-CM | POA: Diagnosis not present

## 2023-09-17 DIAGNOSIS — K219 Gastro-esophageal reflux disease without esophagitis: Secondary | ICD-10-CM | POA: Diagnosis not present

## 2023-09-17 DIAGNOSIS — M546 Pain in thoracic spine: Secondary | ICD-10-CM | POA: Diagnosis not present

## 2023-09-17 DIAGNOSIS — I10 Essential (primary) hypertension: Secondary | ICD-10-CM

## 2023-09-17 DIAGNOSIS — G8929 Other chronic pain: Secondary | ICD-10-CM | POA: Insufficient documentation

## 2023-09-17 DIAGNOSIS — Z23 Encounter for immunization: Secondary | ICD-10-CM

## 2023-09-17 MED ORDER — LIDOCAINE 4 % EX PTCH: 1.0000 | MEDICATED_PATCH | CUTANEOUS | 1 refills | Status: DC

## 2023-09-17 MED ORDER — PANTOPRAZOLE SODIUM 40 MG PO TBEC: 40.0000 mg | DELAYED_RELEASE_TABLET | Freq: Every day | ORAL | 1 refills | Status: DC

## 2023-09-17 MED ORDER — CARVEDILOL 12.5 MG PO TABS: 12.5000 mg | ORAL_TABLET | Freq: Two times a day (BID) | ORAL | 1 refills | Status: DC

## 2023-09-17 MED ORDER — CELECOXIB 200 MG PO CAPS: 200.0000 mg | ORAL_CAPSULE | Freq: Two times a day (BID) | ORAL | 1 refills | Status: AC

## 2023-09-17 NOTE — Progress Notes (Signed)
 New Patient Office Visit  Subjective    Patient ID: Kristina Sloan, female    DOB: 1945/07/10  Age: 78 y.o. MRN: [id number]  CC:  Chief Complaint  Patient presents with   Establish Care    HPI Kristina Sloan presents to establish care   Discussed the use of AI scribe software for clinical note transcription with the patient, who gave verbal consent to proceed.  History of Present Illness Kristina Sloan is a 77 year old female who presents with worsening back pain and memory issues.  She has experienced chronic back pain for many years, with flare-ups occurring five out of seven days a week. The pain is severe and persistent, and tramadol  does not alleviate it. Gabapentin  was effective for several months before it stopped working. Lidocaine  patches provide inconsistent relief, and cyclobenzaprine sometimes helps. She is currently taking Celebrex  twice daily but is unsure of its effectiveness. She reports having had an MRI of her low back in February 2023 and was told it showed degenerative changes at L4-L5 and L5-S1.  She is experiencing memory issues, particularly with recent memory, such as forgetting where she places items in her house. No episodes of getting lost while driving, but she is cautious about traveling to unfamiliar places. She is concerned about these memory issues, especially given her age and family history of dementia, as her uncle has dementia.  She has a history of significant weight loss, having lost 20 pounds in the last three months, which she attributes to dietary changes following her husband's mitral valve replacement surgery. Her husband also lost 20 pounds during this period.     Outpatient Encounter Medications as of 09/17/2023  Medication Sig   ALPRAZolam  (XANAX ) 1 MG tablet Take 0.5 tablets (0.5 mg total) by mouth 2 (two) times daily as needed for anxiety.   Biotin 5000 MCG TABS Take 1 tablet by mouth daily.   Cholecalciferol  (VITAMIN D3) 2000  units capsule Take 2,000 Units by mouth daily.   Cyanocobalamin  (VITAMIN B-12) 5000 MCG TBDP Take 1 tablet by mouth daily.   diltiazem  (DILACOR XR ) 240 MG 24 hr capsule Take 1 capsule (240 mg total) by mouth daily.   pravastatin  (PRAVACHOL ) 10 MG tablet Take 1 tablet (10 mg total) by mouth daily.   traMADol  (ULTRAM ) 50 MG tablet TAKE 1 TABLET BY MOUTH EVERY 6 HOURS AS NEEDED.   [DISCONTINUED] carvedilol  (COREG ) 12.5 MG tablet Take 1 tablet (12.5 mg total) by mouth 2 (two) times daily with a meal.   [DISCONTINUED] celecoxib  (CELEBREX ) 200 MG capsule Take 200 mg by mouth daily.   [DISCONTINUED] lidocaine  (HM LIDOCAINE  PATCH) 4 % Place 1 patch onto the skin daily.   [DISCONTINUED] pantoprazole  (PROTONIX ) 40 MG tablet Take 40 mg by mouth daily.   carvedilol  (COREG ) 12.5 MG tablet Take 1 tablet (12.5 mg total) by mouth 2 (two) times daily with a meal.   celecoxib  (CELEBREX ) 200 MG capsule Take 1 capsule (200 mg total) by mouth 2 (two) times daily.   lidocaine  (HM LIDOCAINE  PATCH) 4 % Place 1 patch onto the skin daily.   pantoprazole  (PROTONIX ) 40 MG tablet Take 1 tablet (40 mg total) by mouth daily.   [DISCONTINUED] gabapentin  (NEURONTIN ) 300 MG capsule Take 1 capsule (300 mg total) by mouth 3 (three) times daily as needed.   No facility-administered encounter medications on file as of 09/17/2023.    Past Medical History:  Diagnosis Date   Anxiety 1970's   Arthritis ?  Breast cancer (HCC) 2003   Breast cyst    Cancer (HCC) 2003   rt breast 2003 lumpectomy/rad tx   Chest pain 05/23/2012   Complication of anesthesia    DIFFICULT WAKING ONLY ONCE   Hard of hearing    Hypertension    Scoliosis     Past Surgical History:  Procedure Laterality Date   BREAST LUMPECTOMY Right 12/2001   COCHLEAR IMPLANT Left 2018   COLONOSCOPY N/A 07/29/2013   Procedure: COLONOSCOPY;  Surgeon: Lamar CHRISTELLA Hollingshead, MD;  Location: [facility];  Service: Endoscopy;  Laterality: N/A;  2:00 PM-moved to 915 Pt  notified of time change   EYE SURGERY  2014   Strabysmis   Left Jenetta Procedure  1994   STRABISMUS SURGERY Right    TUBAL LIGATION      Family History  Problem Relation Age of Onset   COPD Mother    Hypertension Mother    Bladder Cancer Father    Bladder Cancer Brother    Cancer Brother    Breast cancer Neg Hx     Social History   Socioeconomic History   Marital status: Married    Spouse name: Not on file   Number of children: 0   Years of education: Not on file   Highest education level: Associate degree: occupational, Scientist, product/process development, or vocational program  Occupational History   Occupation: retired  Tobacco Use   Smoking status: Former    Current packs/day: 0.00    Average packs/day: 1 pack/day for 25.0 years (25.0 ttl pk-yrs)    Types: Cigarettes    Start date: 01/09/1965    Quit date: 01/09/1990    Years since quitting: 33.7   Smokeless tobacco: Never  Vaping Use   Vaping status: Never Used  Substance and Sexual Activity   Alcohol use: Yes    Alcohol/week: 7.0 standard drinks of alcohol    Types: 7 Glasses of wine per week    Comment: q pm, white wine   Drug use: No   Sexual activity: Not Currently    Birth control/protection: None    Comment: I'm 78 years old  Other Topics Concern   Not on file  Social History Narrative   [facility] (03/15/16):   Originally from [place]. She has lived in [place] since 1951. She grew up with her mother. She has traveled to [place] (in June), [place], [place], [place], [place], [place], & multiple [place]  states. Previously worked in a Field seismologist. Currently has a dog. No bird or mold exposure. No recent hot tub exposure. Enjoys photography.    Social Drivers of Corporate investment banker Strain: Low Risk  (08/13/2023)   Overall Financial Resource Strain (CARDIA)    Difficulty of Paying Living Expenses: Not hard at all  Food Insecurity: No Food Insecurity (08/13/2023)   Hunger Vital Sign    Worried About Running Out of Food  in the Last Year: Never true    Ran Out of Food in the Last Year: Never true  Transportation Needs: No Transportation Needs (08/13/2023)   PRAPARE - Administrator, Civil Service (Medical): No    Lack of Transportation (Non-Medical): No  Physical Activity: Insufficiently Active (08/13/2023)   Exercise Vital Sign    Days of Exercise per Week: 3 days    Minutes of Exercise per Session: 20 min  Stress: No Stress Concern Present (08/13/2023)   [employer] of Occupational Health - Occupational Stress Questionnaire    Feeling  of Stress: Only a little  Social Connections: Moderately Isolated (08/13/2023)   Social Connection and Isolation Panel    Frequency of Communication with Friends and Family: Three times a week    Frequency of Social Gatherings with Friends and Family: Once a week    Attends Religious Services: Never    Database administrator or Organizations: No    Attends Engineer, structural: Not on file    Marital Status: Married  Catering manager Violence: Not At Risk (06/20/2023)   Humiliation, Afraid, Rape, and Kick questionnaire    Fear of Current or Ex-Partner: No    Emotionally Abused: No    Physically Abused: No    Sexually Abused: No    ROS All review of systems negative except what is listed in the HPI      Objective    BP 130/69   Pulse 61   Ht 5' 4 (1.626 m)   Wt 134 lb (60.8 kg)   SpO2 100%   BMI 23.00 kg/m   Physical Exam Vitals reviewed.  Constitutional:      Appearance: Normal appearance.  Cardiovascular:     Rate and Rhythm: Normal rate and regular rhythm.     Heart sounds: Normal heart sounds.  Pulmonary:     Effort: Pulmonary effort is normal.     Breath sounds: Normal breath sounds.  Musculoskeletal:        General: Normal range of motion.     Comments: Right lumbar paraspinal muscle tension  Skin:    General: Skin is warm and dry.  Neurological:     Mental Status: She is alert and oriented to person, place, and time.   Psychiatric:        Mood and Affect: Mood normal.        Behavior: Behavior normal.        Thought Content: Thought content normal.        Judgment: Judgment normal.            Assessment & Plan:   Problem List Items Addressed This Visit       Active Problems   HTN (hypertension) (Chronic)   Blood pressure is at goal for age and co-morbidities.   Recommendations: continue current meds - BP goal <130/80 - monitor and log blood pressures at home - check around the same time each day in a relaxed setting - Limit salt to <2000 mg/day - Follow DASH eating plan (heart healthy diet) - limit alcohol to 2 standard drinks per day for men and 1 per day for women - avoid tobacco products - get at least 2 hours of regular aerobic exercise weekly Patient aware of signs/symptoms requiring further/urgent evaluation.        Relevant Medications   carvedilol  (COREG ) 12.5 MG tablet   GERD (gastroesophageal reflux disease)   Stable. Continue Protonix .      Relevant Medications   pantoprazole  (PROTONIX ) 40 MG tablet   Chronic right-sided thoracic back pain - Primary   - Refer to orthopedics for evaluation and management - Prescribe lidocaine  patches; check insurance coverage. - Continue Celebrex  twice daily, monitor for gastrointestinal side effects. - Encourage physical therapy reports appt next week - Consider retrying gabapentin  before travel to reassess effectiveness. - Schedule follow-up in three months for medication management and potential controlled substances contract if she desires to continue tramadol  and/or xanax       Relevant Medications   lidocaine  (HM LIDOCAINE  PATCH) 4 %   celecoxib  (CELEBREX )  200 MG capsule   Other Relevant Orders   Ambulatory referral to Orthopedic Surgery   Other Visit Diagnoses       Encounter for medical examination to establish care         Flu vaccine need       Relevant Orders   Flu vaccine HIGH DOSE PF(Fluzone Trivalent)  (Completed)           Return in about 3 months (around 12/17/2023) for chronic disease management.   [facility] KATHEE Mon, NP

## 2023-09-17 NOTE — Assessment & Plan Note (Signed)
 Stable ?Continue Protonix ?

## 2023-09-17 NOTE — Assessment & Plan Note (Signed)
Blood pressure is at goal for age and co-morbidities.   Recommendations: continue current meds - BP goal <130/80 - monitor and log blood pressures at home - check around the same time each day in a relaxed setting - Limit salt to <2000 mg/day - Follow DASH eating plan (heart healthy diet) - limit alcohol to 2 standard drinks per day for men and 1 per day for women - avoid tobacco products - get at least 2 hours of regular aerobic exercise weekly Patient aware of signs/symptoms requiring further/urgent evaluation.   

## 2023-09-17 NOTE — Assessment & Plan Note (Signed)
-   Refer to orthopedics for evaluation and management - Prescribe lidocaine  patches; check insurance coverage. - Continue Celebrex  twice daily, monitor for gastrointestinal side effects. - Encourage physical therapy reports appt next week - Consider retrying gabapentin  before travel to reassess effectiveness. - Schedule follow-up in three months for medication management and potential controlled substances contract if she desires to continue tramadol  and/or xanax

## 2023-09-24 ENCOUNTER — Encounter: Payer: Self-pay | Admitting: Family Medicine

## 2023-09-24 DIAGNOSIS — G8929 Other chronic pain: Secondary | ICD-10-CM

## 2023-10-01 ENCOUNTER — Encounter: Payer: Self-pay | Admitting: Family Medicine

## 2023-10-01 ENCOUNTER — Other Ambulatory Visit (HOSPITAL_BASED_OUTPATIENT_CLINIC_OR_DEPARTMENT_OTHER): Payer: Self-pay

## 2023-10-01 DIAGNOSIS — G8929 Other chronic pain: Secondary | ICD-10-CM

## 2023-10-01 MED ORDER — PREGABALIN 50 MG PO CAPS
50.0000 mg | ORAL_CAPSULE | Freq: Two times a day (BID) | ORAL | 0 refills | Status: DC
  Filled 2023-10-01: qty 14, 7d supply, fill #0

## 2023-10-04 ENCOUNTER — Telehealth: Payer: Self-pay | Admitting: Family Medicine

## 2023-10-04 NOTE — Telephone Encounter (Signed)
 Copied from CRM #[id number]. Topic: Clinical - Medication Question >> Oct 04, 2023  4:48 PM Tysheama G wrote: Reason for CRM: Patient is trying to get medication traMADol  (ULTRAM ) 50 MG tablet refilled but pharmacy stated they need approval from [facility]. She was taking that medication with her old doctor. She wants to know if she needs to make an appointment with her before she can get it. Callback number [phone]

## 2023-10-05 NOTE — Telephone Encounter (Signed)
 Was given medication for acute pain of left shoulder. Last written 08/06/2023

## 2023-10-05 NOTE — Telephone Encounter (Signed)
 LVM for patient to call back. To let her know [facility] would not refill due to patient also being on Lyrica . Can schedule appt to discuss other options. Awaiting call back. [phone].

## 2023-10-05 NOTE — Telephone Encounter (Signed)
 I try not to use Lyrica  and opioids together. She will need an appointment to discuss if she wants to change, but I will not do long-term opioids.

## 2023-10-10 ENCOUNTER — Ambulatory Visit: Admitting: Family Medicine

## 2023-10-10 ENCOUNTER — Encounter: Payer: Self-pay | Admitting: Family Medicine

## 2023-10-10 ENCOUNTER — Other Ambulatory Visit (HOSPITAL_BASED_OUTPATIENT_CLINIC_OR_DEPARTMENT_OTHER): Payer: Self-pay

## 2023-10-10 VITALS — BP 137/66 | HR 58 | Ht 64.0 in | Wt 134.0 lb

## 2023-10-10 DIAGNOSIS — G8929 Other chronic pain: Secondary | ICD-10-CM

## 2023-10-10 DIAGNOSIS — M546 Pain in thoracic spine: Secondary | ICD-10-CM

## 2023-10-10 DIAGNOSIS — I10 Essential (primary) hypertension: Secondary | ICD-10-CM

## 2023-10-10 DIAGNOSIS — E785 Hyperlipidemia, unspecified: Secondary | ICD-10-CM

## 2023-10-10 MED ORDER — PRAVASTATIN SODIUM 10 MG PO TABS: 10.0000 mg | ORAL_TABLET | Freq: Every day | ORAL | 1 refills | Status: AC

## 2023-10-10 MED ORDER — DILTIAZEM HCL ER 240 MG PO CP24: 240.0000 mg | ORAL_CAPSULE | Freq: Every day | ORAL | 1 refills | Status: AC

## 2023-10-10 MED ORDER — DULOXETINE HCL 30 MG PO CPEP
30.0000 mg | ORAL_CAPSULE | Freq: Every day | ORAL | 3 refills | Status: DC
  Filled 2023-10-10: qty 30, 30d supply, fill #0

## 2023-10-10 NOTE — Progress Notes (Signed)
 Established Patient Office Visit  Subjective   Patient ID: Kristina Sloan, female    DOB: Dec 17, 1945  Age: 78 y.o. MRN: [id number]  Chief Complaint  Patient presents with   Medical Management of Chronic Issues    HPI   Discussed the use of AI scribe software for clinical note transcription with the patient, who gave verbal consent to proceed.  History of Present Illness Kristina Sloan is a 78 year old female with chronic right back pain who presents for pain management and medication review.  She experiences chronic pain described as a burning sensation similar to a 'hot poker' in her right mid/lower back, persisting for years. The pain does not disturb her sleep but intensifies when she is awake, possibly linked to gravity or movement. She has a history of scoliosis and has experienced similar pain for many years.  She has tried various medications for pain relief. Gabapentin  initially provided relief but later caused sleepwalking, necessitating increased home security measures. Lyrica  was trialed for seven days but affected her memory and cognition, leading to discontinuation. Lidocaine  patches and Voltaren gel have not provided significant relief, and she currently uses Thermacare heat wraps for temporary relief.  She has a history of using hydrocodone, which she states is the only medication that has effectively alleviated her pain in the past. She attempted physical therapy but paused due to scheduling conflicts and plans to resume after her upcoming vacation.  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              ROS All review of systems negative except what is listed in the HPI    Objective:     BP 137/66   Pulse (!) 58   Ht 5' 4 (1.626 m)   Wt 134 lb (60.8 kg)   SpO2 99%   BMI 23.00 kg/m    Physical Exam Vitals reviewed.  Constitutional:      General: She is not  in acute distress.    Appearance: Normal appearance. She is not ill-appearing.  Neurological:     General: No focal deficit present.     Mental Status: She is alert and oriented to person, place, and time.  Psychiatric:        Mood and Affect: Mood normal.        Behavior: Behavior normal.        Thought Content: Thought content normal.        Judgment: Judgment normal.          No results found for any visits on 10/10/23.    The 10-year ASCVD risk score (Arnett DK, et al., 2019) is: 23.7%    Assessment & Plan:   Problem List Items Addressed This Visit       Active Problems   HTN (hypertension) (Chronic)   Stable. Refills provided.       Relevant Medications   pravastatin  (PRAVACHOL ) 10 MG tablet   diltiazem  (DILACOR XR ) 240 MG 24 hr capsule   Hyperlipidemia (Chronic)   Stable. Refills provided.       Relevant Medications   pravastatin  (PRAVACHOL ) 10 MG tablet   diltiazem  (DILACOR XR ) 240 MG 24 hr capsule   Chronic right-sided thoracic back pain - Primary   Chronic thoracic back pain with burning sensation, unresponsive to gabapentin  and Lyrica . Lidocaine  patches and heating pads provide partial relief. Previous MRI shows mild degenerative  changes. Hydrocodone effective but not suitable long-term. - Prescribe duloxetine for one-week trial to assess efficacy and side effects. - Continue heating pads, capsaicin cream, and Thermacare. - Refer to pain management specialist she has seen previously in [place]. - Consider short-term hydrocodone for severe pain during trip. - Encourage resumption of physical therapy post-vacation.      Relevant Medications   DULoxetine (CYMBALTA) 30 MG capsule   Other Relevant Orders   Ambulatory referral to Pain Clinic           Return if symptoms worsen or fail to improve, for - touch base after trial of duloxetine; routine f/u after travels.    Waddell KATHEE Mon, NP

## 2023-10-10 NOTE — Assessment & Plan Note (Signed)
 Chronic thoracic back pain with burning sensation, unresponsive to gabapentin  and Lyrica . Lidocaine  patches and heating pads provide partial relief. Previous MRI shows mild degenerative changes. Hydrocodone effective but not suitable long-term. - Prescribe duloxetine for one-week trial to assess efficacy and side effects. - Continue heating pads, capsaicin cream, and Thermacare. - Refer to pain management specialist she has seen previously in [place]. - Consider short-term hydrocodone for severe pain during trip. - Encourage resumption of physical therapy post-vacation.

## 2023-10-10 NOTE — Assessment & Plan Note (Signed)
 Stable. Refills provided.

## 2023-10-14 ENCOUNTER — Encounter: Payer: Self-pay | Admitting: Family Medicine

## 2023-10-14 DIAGNOSIS — G8929 Other chronic pain: Secondary | ICD-10-CM

## 2023-10-15 ENCOUNTER — Telehealth: Payer: Self-pay | Admitting: Family Medicine

## 2023-10-15 DIAGNOSIS — G8929 Other chronic pain: Secondary | ICD-10-CM

## 2023-10-15 MED ORDER — HYDROCODONE-ACETAMINOPHEN 5-325 MG PO TABS: 1.0000 | ORAL_TABLET | Freq: Four times a day (QID) | ORAL | 0 refills | Status: DC | PRN

## 2023-10-15 NOTE — Telephone Encounter (Unsigned)
 Copied from CRM [id number]. Topic: Clinical - Prescription Issue >> Oct 15, 2023  4:56 PM [place] J wrote: Reason for CRM: Patient's pharmacy will not have her HYDROcodone-acetaminophen  in stock until October 20th. She is wondering if the medication could be sent to:  [facility] [place] [facility] [address] [place] [address] Phone: [phone] Fax: [phone] Hours: M-F 7:30a-7:00p  Patient goes out to [place] on the 15th and needs the medications transferred to a different pharmacy as soon as possible.

## 2023-10-16 ENCOUNTER — Other Ambulatory Visit (HOSPITAL_BASED_OUTPATIENT_CLINIC_OR_DEPARTMENT_OTHER): Payer: Self-pay

## 2023-10-16 MED ORDER — HYDROCODONE-ACETAMINOPHEN 5-325 MG PO TABS
1.0000 | ORAL_TABLET | Freq: Four times a day (QID) | ORAL | 0 refills | Status: DC | PRN
  Filled 2023-10-16: qty 30, 8d supply, fill #0

## 2023-10-16 NOTE — Telephone Encounter (Signed)
 Called and cancelled hydrocodone downstairs. See patient mychart message.

## 2023-10-16 NOTE — Telephone Encounter (Signed)
 Changing to our pharmacy. Can you please call and cancel Rx at [facility]? Thank you

## 2023-10-16 NOTE — Telephone Encounter (Signed)
 Patient wrote a mychart earlier stating this was a mistake, no documentation earlier in chart. She was picking up from [facility] today.

## 2023-10-30 ENCOUNTER — Telehealth: Payer: Self-pay | Admitting: *Deleted

## 2023-10-30 NOTE — Telephone Encounter (Signed)
 Yes, fine to see if someone else can see her sooner.

## 2023-10-30 NOTE — Telephone Encounter (Signed)
 Copied from CRM #[id number]. Topic: Referral - Question >> Oct 30, 2023 11:17 AM Rea ORN wrote: Reason for CRM: pt called to advise PCP that she can't get into [facility] until 03/31/24. She is asking if there is another office she can be referred to. She is not pleased with waiting that long to be evaluted. Please call back.

## 2023-10-30 NOTE — Telephone Encounter (Signed)
 Looks like [facility] sent referral in August. Okay to ask referral team to forward to another office?

## 2023-11-06 ENCOUNTER — Other Ambulatory Visit (HOSPITAL_BASED_OUTPATIENT_CLINIC_OR_DEPARTMENT_OTHER): Payer: Self-pay

## 2023-11-07 ENCOUNTER — Encounter: Payer: Self-pay | Admitting: Family Medicine

## 2023-11-07 DIAGNOSIS — G8929 Other chronic pain: Secondary | ICD-10-CM

## 2023-11-07 MED ORDER — DULOXETINE HCL 60 MG PO CPEP: 60.0000 mg | ORAL_CAPSULE | Freq: Every day | ORAL | 3 refills | Status: AC

## 2023-11-10 ENCOUNTER — Other Ambulatory Visit: Payer: Self-pay | Admitting: Family Medicine

## 2023-11-10 DIAGNOSIS — I10 Essential (primary) hypertension: Secondary | ICD-10-CM

## 2023-11-13 ENCOUNTER — Other Ambulatory Visit: Payer: Self-pay

## 2023-11-13 DIAGNOSIS — F411 Generalized anxiety disorder: Secondary | ICD-10-CM

## 2023-11-13 NOTE — Telephone Encounter (Signed)
 Requesting: alprazolam  1mg   Contract:None UDS: None Last Visit: 10/10/23 Next Visit: None Last Refill: 08/02/23 #30 and 2RF   Please Advise

## 2023-11-15 ENCOUNTER — Other Ambulatory Visit: Payer: Self-pay | Admitting: Neurology

## 2023-11-16 ENCOUNTER — Other Ambulatory Visit: Payer: Self-pay | Admitting: Family Medicine

## 2023-11-16 DIAGNOSIS — K219 Gastro-esophageal reflux disease without esophagitis: Secondary | ICD-10-CM

## 2023-11-16 MED ORDER — ALPRAZOLAM 1 MG PO TABS: 0.5000 mg | ORAL_TABLET | Freq: Two times a day (BID) | ORAL | 0 refills | Status: AC | PRN

## 2023-11-26 ENCOUNTER — Encounter: Payer: Self-pay | Admitting: Family Medicine

## 2024-01-24 ENCOUNTER — Ambulatory Visit: Admitting: Neurology

## 2024-02-07 ENCOUNTER — Other Ambulatory Visit: Payer: Self-pay | Admitting: Family Medicine

## 2024-02-07 DIAGNOSIS — I10 Essential (primary) hypertension: Secondary | ICD-10-CM

## 2024-02-21 ENCOUNTER — Encounter: Payer: Medicare Other | Admitting: Physician Assistant

## 2024-03-31 ENCOUNTER — Ambulatory Visit: Admitting: Neurology

## 2024-06-25 ENCOUNTER — Ambulatory Visit
